# Patient Record
Sex: Female | Born: 2000 | Race: White | Hispanic: No | Marital: Single | Smoking: Never smoker
Health system: Southern US, Community
[De-identification: ages and names within clinical notes are randomized; demographics above are authoritative.]

## PROBLEM LIST (undated history)

## (undated) DIAGNOSIS — K219 Gastro-esophageal reflux disease without esophagitis: Secondary | ICD-10-CM

## (undated) DIAGNOSIS — T7840XA Allergy, unspecified, initial encounter: Secondary | ICD-10-CM

## (undated) DIAGNOSIS — J45909 Unspecified asthma, uncomplicated: Secondary | ICD-10-CM

## (undated) DIAGNOSIS — F419 Anxiety disorder, unspecified: Secondary | ICD-10-CM

## (undated) DIAGNOSIS — F32A Depression, unspecified: Secondary | ICD-10-CM

## (undated) DIAGNOSIS — K5901 Slow transit constipation: Secondary | ICD-10-CM

## (undated) DIAGNOSIS — K3184 Gastroparesis: Secondary | ICD-10-CM

## (undated) HISTORY — DX: Gastroparesis: K31.84

## (undated) HISTORY — PX: WISDOM TOOTH EXTRACTION: SHX21

## (undated) HISTORY — DX: Anxiety disorder, unspecified: F41.9

## (undated) HISTORY — DX: Allergy, unspecified, initial encounter: T78.40XA

## (undated) HISTORY — DX: Gastro-esophageal reflux disease without esophagitis: K21.9

## (undated) HISTORY — PX: NASAL ENDOSCOPY: SHX6577

---

## 2020-09-07 ENCOUNTER — Encounter: Payer: Self-pay | Admitting: Gastroenterology

## 2020-10-24 ENCOUNTER — Ambulatory Visit (INDEPENDENT_AMBULATORY_CARE_PROVIDER_SITE_OTHER): Payer: BC Managed Care – PPO | Admitting: Gastroenterology

## 2020-10-24 ENCOUNTER — Telehealth: Payer: Self-pay | Admitting: Gastroenterology

## 2020-10-24 ENCOUNTER — Encounter: Payer: Self-pay | Admitting: Gastroenterology

## 2020-10-24 VITALS — BP 100/70 | HR 70 | Ht 65.0 in | Wt 198.0 lb

## 2020-10-24 DIAGNOSIS — G8929 Other chronic pain: Secondary | ICD-10-CM

## 2020-10-24 DIAGNOSIS — R1031 Right lower quadrant pain: Secondary | ICD-10-CM | POA: Diagnosis not present

## 2020-10-24 NOTE — Telephone Encounter (Signed)
Left message on machine to call back  

## 2020-10-24 NOTE — Telephone Encounter (Signed)
Reviewed records from CT scan abdomen pelvis with IV and oral contrast on October 09, 2020 in [place].  Ordering physician was her gastroenterologist Dr. Marcha Dutton.  Indication abdominal pain.  Findings "right adnexa 2.0 x 1.6 cm dermoid" the examination was otherwise normal.   Wanda Terry, Wanda Terry goes by the name Wanda Terry.  Can you please call him and let him know that we received the CT report.  He had a relatively small, 2 cm right ovarian dermoid.  This may or may not be causing his lower abdominal discomforts.  I would like to refer him to a gynecologist for their opinion on this.  He also mentioned rather chronic constipation and for that please recommend he start taking Citrucel on a daily basis with a lot of water intake at that time.  Please let him know that if the gynecologist does not feel the dermoid is causing his discomforts then he should contact me again for further testing, treatment options.

## 2020-10-24 NOTE — Progress Notes (Signed)
HPI: This is a very pleasant 20 year old female who identifies as a female, prefers to be called Wanda Terry.  Wanda Terry had issues with his stomach starting when he was 45 or 83.  He was having upper abdominal pains around then.  He had a getting upper endoscopy performed and was told that he had "3 ulcers".  He does recall that he was not H. pylori positive and he was not on NSAIDs around the time.  Unclear why he had these ulcers but symptoms seem to get better until the past few months when he started having a new problem of lower abdominal dull discomfort.  He feels bloated all the time.  He has lost about 10 pounds in the past year or 2.  He has chronic constipation usually moves his bowels about 5 times per week but only with a lot of pushing and straining.  He has had no bleeding.  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  Wanda Terry was never told exactly how large or exactly where the desmoid tumor was.  He has no copy of the report.  He did bring a disc in with him.  I tried to load the disc through the only computer on our clinic that actually has a disc drive anymore.  The computer rejected the disc.  I gave the disc back to him.   Old Data Reviewed: I reviewed a 25 page packet from [place] team.  I really see a variety of medication list problem list, medical history use.  No lab or imaging test results.    Review of systems: Pertinent positive and negative review of systems were noted in the above HPI section. All other review negative.   Past Medical History:  Diagnosis Date  . Gastroparesis     History reviewed. No pertinent surgical history.  Current Outpatient Medications  Medication Sig Dispense Refill  . cetirizine  (ZYRTEC) 10 MG tablet Take 10 mg by mouth daily.    Marland Kitchen EPINEPHrine 0.3 mg/0.3 mL IJ SOAJ injection Inject 0.3 mg into the muscle as needed for anaphylaxis.    . famotidine (PEPCID) 20 MG tablet Take 20 mg by mouth as needed for heartburn or indigestion.    Marland Kitchen lamoTRIgine (LAMICTAL) 150 MG tablet Take 150 mg by mouth daily.    . sertraline (ZOLOFT) 100 MG tablet Take 100 mg by mouth daily.     No current facility-administered medications for this visit.    Allergies as of 10/24/2020 - Review Complete 10/24/2020  Allergen Reaction Noted  . Amoxicillin Anaphylaxis 10/23/2020    History reviewed. No pertinent family history.  Social History   Socioeconomic History  . Marital status: Single    Spouse name: Not on file  . Number of children: Not on file  . Years of education: Not on file  . Highest education level: Not on file  Occupational History  . Not on file  Tobacco Use  . Smoking status: Never Smoker  . Smokeless tobacco: Never Used  Substance and Sexual Activity  . Alcohol use: Not Currently  . Drug use: Never  . Sexual activity: Not on file  Other Topics Concern  . Not on file  Social History Narrative  .  Not on file   Social Determinants of Health   Financial Resource Strain: Not on file  Food Insecurity: Not on file  Transportation Needs: Not on file  Physical Activity: Not on file  Stress: Not on file  Social Connections: Not on file  Intimate Partner Violence: Not on file     Physical Exam: BP 100/70   Pulse 70   Ht 5\' 5"  (1.651 m)   Wt 198 lb (89.8 kg)   BMI 32.95 kg/m  Constitutional: generally well-appearing Psychiatric: alert and oriented x3 Eyes: extraocular movements intact Mouth: oral pharynx moist, no lesions Neck: supple no lymphadenopathy Cardiovascular: heart regular rate and rhythm Lungs: clear to auscultation bilaterally Abdomen: soft, nontender, nondistended, no obvious ascites, no peritoneal signs, normal bowel sounds Extremities:  no lower extremity edema bilaterally Skin: no lesions on visible extremities   Assessment and plan: 20 y.o. 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  We will reach out to the radiologist that read it and see if we get a report sent here.  If there is any questions he understands that he might need repeat imaging.  We may also be able to get his images from his [place] CT scan uploaded into the computer system through the radiologist group here in town so that they can be viewed locally.  After reviewing the CT report we will make a plan and go from there.  I see no reason or purpose for any further testing prior to then.  Please see the "Patient Instructions" section for addition details about the plan.   [place], MD [facility] 10/24/2020, 3:13 PM  Cc: 12/22/2020  Total time on date of encounter was 45  minutes (this included time spent preparing to see the patient reviewing records; obtaining and/or reviewing separately obtained history; performing a medically appropriate exam and/or evaluation; counseling and educating the patient and family if present; ordering medications, tests or procedures if applicable; and documenting clinical information in the health record).

## 2020-10-24 NOTE — Patient Instructions (Signed)
If you are age 20 or younger, your body mass index should be between 19-25. Your Body mass index is 32.95 kg/m. If this is out of the aformentioned range listed, please consider follow up with your Primary Care Provider.   We have requested results of CT scan done in [place].  Once we receive results, we will further discuss plan of care.  Thank you for entrusting me with your care and choosing [facility].  Dr Christella Hartigan

## 2020-10-25 NOTE — Telephone Encounter (Signed)
Left message on machine to call back  

## 2020-10-26 NOTE — Telephone Encounter (Signed)
The pt has been advised and states that he will begin Citrucel as ordered.  He also states that he has GYN in mind that he would like to see at [facility] and will call and make the appt himself.  I offered to have records faxed or make referral for him however, he declined and states he will call back if he needs anything further from our office.

## 2020-10-26 NOTE — Telephone Encounter (Signed)
Left message on machine to call back  

## 2020-10-30 ENCOUNTER — Telehealth: Payer: Self-pay | Admitting: Gastroenterology

## 2020-10-30 NOTE — Telephone Encounter (Signed)
Dr Ardis Hughs the pt wants a note for school excusing him from an upcoming school trip due to abdominal pain.  He is working with his mom to get an appt with GYN.  Please advise

## 2020-10-30 NOTE — Telephone Encounter (Signed)
I'm not comfortable writing that note.

## 2020-10-30 NOTE — Telephone Encounter (Signed)
Patients mother called requesting a letter for school to excuse patient from an upcoming trip

## 2020-10-30 NOTE — Telephone Encounter (Signed)
The pt and her mother have been advised that Dr Ardis Hughs is not comfortable writing a note.  The pt has been advised of the information and verbalized understanding.

## 2020-11-01 ENCOUNTER — Other Ambulatory Visit: Payer: BC Managed Care – PPO

## 2020-11-01 ENCOUNTER — Other Ambulatory Visit: Payer: Self-pay

## 2020-11-01 DIAGNOSIS — Z20822 Contact with and (suspected) exposure to covid-19: Secondary | ICD-10-CM

## 2020-11-03 LAB — SARS-COV-2, NAA 2 DAY TAT

## 2020-11-03 LAB — NOVEL CORONAVIRUS, NAA: SARS-CoV-2, NAA: NOT DETECTED

## 2020-11-10 ENCOUNTER — Encounter: Payer: Self-pay | Admitting: Obstetrics and Gynecology

## 2020-11-10 ENCOUNTER — Ambulatory Visit (INDEPENDENT_AMBULATORY_CARE_PROVIDER_SITE_OTHER): Payer: BC Managed Care – PPO | Admitting: Obstetrics and Gynecology

## 2020-11-10 ENCOUNTER — Other Ambulatory Visit: Payer: Self-pay

## 2020-11-10 VITALS — BP 100/62 | Ht 65.0 in | Wt 194.0 lb

## 2020-11-10 DIAGNOSIS — N83201 Unspecified ovarian cyst, right side: Secondary | ICD-10-CM

## 2020-11-10 DIAGNOSIS — R102 Pelvic and perineal pain: Secondary | ICD-10-CM

## 2020-11-10 NOTE — Progress Notes (Signed)
Patient ID: Wanda Terry, adult   DOB: 2001-07-18, 20 y.o.   MRN: [id number]  Reason for Consult: Pelvic Pain (Pelvic pain - RM 1)   Referred by No ref. provider found  Subjective:     HPI:  Wanda Terry is a 20 y.o. adult. Wanda Terry reports that Wanda Terry has had on and off crampy lower abdominal pain since September. Wanda Terry was seen by a GI doctor. They ordered a CT scan which showed a right dermoid cyst.   Gynecological History LMP: 10/28/2019 Describes periods as monthly.  Last pap smear: Under 21 Last Mammogram: Under 40 Sexually Active: yes, same sex relationship. Identifies as female.   Obstetrical History G0P0  Past Medical History:  Diagnosis Date  . Gastroparesis    Family History  Problem Relation Age of Onset  . Diabetes Maternal Grandfather   . Heart disease Maternal Grandfather    History reviewed. No pertinent surgical history.  Short Social History:  Social History   Tobacco Use  . Smoking status: Never Smoker  . Smokeless tobacco: Never Used  Substance Use Topics  . Alcohol use: Not Currently    Allergies  Allergen Reactions  . Amoxicillin Anaphylaxis  . Other Anaphylaxis    Peanuts and tree nuts    Current Outpatient Medications  Medication Sig Dispense Refill  . cetirizine (ZYRTEC) 10 MG tablet Take 10 mg by mouth daily.    Marland Kitchen EPINEPHrine 0.3 mg/0.3 mL IJ SOAJ injection Inject 0.3 mg into the muscle as needed for anaphylaxis.    Marland Kitchen lamoTRIgine (LAMICTAL) 150 MG tablet Take 150 mg by mouth 2 (two) times daily.    . sertraline (ZOLOFT) 100 MG tablet Take 100 mg by mouth daily.    . famotidine (PEPCID) 20 MG tablet Take 20 mg by mouth as needed for heartburn or indigestion. (Patient not taking: Reported on 11/10/2020)     No current facility-administered medications for this visit.    Review of Systems  Constitutional: Negative for chills, fatigue, fever and unexpected weight change.  HENT: Negative for trouble swallowing.  Eyes: Negative for  loss of vision.  Respiratory: Negative for cough, shortness of breath and wheezing.  Cardiovascular: Negative for chest pain, leg swelling, palpitations and syncope.  GI: Negative for abdominal pain, blood in stool, diarrhea, nausea and vomiting.  GU: Negative for difficulty urinating, dysuria, frequency and hematuria.  Musculoskeletal: Negative for back pain, leg pain and joint pain.  Skin: Negative for rash.  Neurological: Negative for dizziness, headaches, light-headedness, numbness and seizures.  Psychiatric: Negative for behavioral problem, confusion, depressed mood and sleep disturbance.        Objective:  Objective   Vitals:   11/10/20 1604  BP: 100/62  Weight: 194 lb (88 kg)  Height: 5\' 5"  (1.651 m)   Body mass index is 32.28 kg/m.  Physical Exam Constitutional:      Appearance: Normal appearance.  HENT:     Head: Normocephalic.     Nose: Nose normal.     Mouth/Throat:     Mouth: Mucous membranes are dry.  Eyes:     Extraocular Movements: Extraocular movements intact.     Pupils: Pupils are equal, round, and reactive to light.  Cardiovascular:     Rate and Rhythm: Normal rate and regular rhythm.  Genitourinary:    Comments: Vulva normal. Did not tolerate pelvic exam.  Neurological:     Mental Status: He is alert.     Assessment/Plan:     20 yo with right ovarian dermoid  Dermoid seen on CT. Will follow up for GYN [place].  Discussed management options of observation versus surgical removal. Patient would like to proceed with surgical removal. Will schedule for robotic ovarian cystectomy. Patient understands that ovarian cystectomy may not resolve complaints of lower abdominal pain.   More than 20 minutes were spent face to face with the patient in the room, reviewing the medical record, labs and images, and coordinating care for the patient. The plan of management was discussed in detail and counseling was provided.      Adrian Prows MD [facility],  [facility] 11/10/2020 4:22 PM

## 2020-11-10 NOTE — Patient Instructions (Addendum)
Diagnostic Laparoscopy Diagnostic laparoscopy is a procedure to diagnose problems in the abdomen. It might be done for a variety of reasons, such as to look for scar tissue, a reason for abdominal pain, an abdominal mass or tumor, or fluid in the abdomen (ascites). This procedure may also be done to remove a tissue sample from the liver to look at under a microscope (biopsy). During the procedure, a thin, flexible tube that has a light and a camera on the end (laparoscope) is inserted through a small incision in the abdomen. The image from the camera is shown on a monitor to help the surgeon see inside the body. Tell a health care provider about:  Any allergies you have.  All medicines you are taking, including vitamins, herbs, eye drops, creams, and over-the-counter medicines.  Any problems you or family members have had with anesthetic medicines.  Any blood disorders you have.  Any surgeries you have had.  Any medical conditions you have.  Whether you are pregnant or may be pregnant. What are the risks? Generally, this is a safe procedure. However, problems may occur, including:  Infection.  Bleeding.  Allergic reactions to medicines or dyes.  Damage to abdominal structures or organs, such as the intestines, liver, stomach, or spleen. What happens before the procedure? Staying hydrated Follow instructions from your health care provider about hydration, which may include:  Up to 2 hours before the procedure - you may continue to drink clear liquids, such as water, clear fruit juice, black coffee, and plain tea.   Eating and drinking restrictions Follow instructions from your health care provider about eating and drinking, which may include:  8 hours before the procedure - stop eating heavy meals or foods, such as meat, fried foods, or fatty foods.  6 hours before the procedure - stop eating light meals or foods, such as toast or cereal.  6 hours before the procedure - stop  drinking milk or drinks that contain milk.  2 hours before the procedure - stop drinking clear liquids. Medicines Ask your health care provider about:  Changing or stopping your regular medicines. This is especially important if you are taking diabetes medicines or blood thinners.  Taking medicines such as aspirin and ibuprofen. These medicines can thin your blood. Do not take these medicines unless your health care provider tells you to take them.  Taking over-the-counter medicines, vitamins, herbs, and supplements. General instructions  Ask your health care provider: ? How your surgery site will be marked. ? What steps will be taken to help prevent infection. These steps may include:  Removing hair at the surgery site.  Washing skin with a germ-killing soap.  Taking antibiotic medicine.  Plan to have a responsible adult take you home from the hospital or clinic.  Plan to have a responsible adult care for you for the time you are told after you leave the hospital or clinic. This is important. What happens during the procedure?  An IV will be inserted into one of your veins.  You will be given one or more of the following: ? A medicine to help you relax (sedative). ? A medicine to numb the area (local anesthetic). ? A medicine to make you fall asleep (general anesthetic).  A breathing tube will be placed down your throat to help you breathe during the procedure.  Your abdomen will be filled with an air-like gas so that your abdomen expands. This will give the surgeon more room to operate and will make  your organs easier to see.  Many small incisions will be made in your abdomen.  A laparoscope and other surgical instruments will be inserted into your abdomen through these incisions.  A biopsy may be done. This will depend on the reason why you are having this procedure.  The laparoscope and other instruments will be removed from your abdomen.  The air-like gas will be  released from your abdomen.  Your incisions will be closed with stitches (sutures), skin glue, or surgical tapes and covered with a bandage (dressing).  Your breathing tube will be removed. The procedure may vary among health care providers and hospitals.   What happens after the procedure?  Your blood pressure, heart rate, breathing rate, and blood oxygen level will be monitored until you leave the hospital or clinic.  If you were given a sedative during the procedure, it can affect you for several hours. Do not drive or operate machinery until your health care provider says that it is safe.  It is up to you to get the results of your procedure. Ask your health care provider, or the department that is doing the procedure, when your results will be ready. Summary  Diagnostic laparoscopy is a procedure to diagnose problems in the abdomen using a thin, flexible tube that has a light and a camera on the end (laparoscope).  Follow instructions from your health care provider about how to prepare for the procedure.  Plan to have a responsible adult care for you for the time you are told after you leave the hospital or clinic. This is important. This information is not intended to replace advice given to you by your health care provider. Make sure you discuss any questions you have with your health care provider. Document Revised: 06/02/2020 Document Reviewed: 06/02/2020 Elsevier Patient Education  2021 [place]. Ovarian Cyst  An ovarian cyst is a fluid-filled sac that forms on an ovary. The ovaries are small organs that produce eggs in women. Various types of cysts can form on the ovaries. Some may cause symptoms and require treatment. Most ovarian cysts go away on their own, are not cancerous (are benign), and do not cause problems. What are the causes? Ovarian cysts may be caused by:  Ovarian hyperstimulation syndrome. This is a condition that can develop from taking fertility  medicines. It causes multiple large ovarian cysts to form.  Polycystic ovarian syndrome (PCOS). This is a common hormonal disorder that can cause ovarian cysts to form, and can cause problems with your period or fertility.  The normal menstrual cycle. What increases the risk? The following factors may make you more likely to develop this condition:  Being overweight or obese.  Taking fertility medicines.  Taking certain forms of hormonal birth control.  Smoking. What are the signs or symptoms? Many ovarian cysts do not cause symptoms. If symptoms are present, they may include:  Pelvic pain or pressure.  Pain in the lower abdomen.  Pain during sex.  Abdominal swelling.  Abnormal menstrual periods.  Increasing pain with menstrual periods. How is this diagnosed? These cysts are commonly found during a routine pelvic exam. You may have tests to find out more about the cyst, such as:  Ultrasound.  CT scan.  MRI.  Blood tests. How is this treated? Many ovarian cysts go away on their own without treatment. Your health care provider may want to check your cyst regularly for 2-3 months to see if it changes. If you are in menopause, it is  especially important to have your cyst monitored closely because menopausal women have a higher rate of ovarian cancer. When treatment is needed, it may include:  Medicines to help relieve pain.  A procedure to drain the cyst (aspiration).  Surgery to remove the whole cyst (cystectomy).  Hormone treatment or birth control pills. These methods are sometimes used to help keep cysts from coming back.  Surgery to remove the ovary (oophorectomy). Follow these instructions at home:  Take over-the-counter and prescription medicines only as told by your health care provider.  Ask your health care provider if any medicine prescribed to you requires you to avoid driving or using machinery.  Get regular pelvic exams and Pap tests as often as  told by your health care provider.  Return to your normal activities as told by your health care provider. Ask your health care provider what activities are safe for you.  Do not use any products that contain nicotine or tobacco, such as cigarettes, e-cigarettes, and chewing tobacco. If you need help quitting, ask your health care provider.  Keep all follow-up visits. This is important. Contact a health care provider if:  Your periods are late, irregular, painful, or they stop.  You have pelvic pain that does not go away.  You have pressure on your bladder or trouble emptying your bladder completely.  You have any of the following: ? A feeling of fullness. ? You are gaining weight or losing weight without changing your exercise and eating habits. ? Pain, swelling, or bloating in the abdomen. ? Loss of appetite. ? Pain and pressure in your back and pelvis.  You think you may be pregnant. Get help right away if:  You have abdominal or pelvic pain that is severe or gets worse.  You cannot eat or drink without vomiting.  You suddenly develop a fever or chills.  Your menstrual period is much heavier than usual. Summary  An ovarian cyst is a fluid-filled sac that forms on an ovary.  Some ovarian cysts may cause symptoms and require treatment.  These cysts are commonly found during a routine pelvic exam.  Many ovarian cysts go away on their own without treatment. This information is not intended to replace advice given to you by your health care provider. Make sure you discuss any questions you have with your health care provider. Document Revised: 03/16/2020 Document Reviewed: 03/16/2020 Elsevier Patient Education  2021 [employer].

## 2020-11-15 ENCOUNTER — Telehealth: Payer: Self-pay

## 2020-11-15 NOTE — Telephone Encounter (Signed)
Pt would like a call back from nurse. Pt is asking if the [place] is needed in order for her to have her laparoscopic surgery or if she can still have surgery without the [place]. Please advise. Thanks TNP

## 2020-11-17 ENCOUNTER — Other Ambulatory Visit: Payer: Self-pay | Admitting: Obstetrics and Gynecology

## 2020-11-17 DIAGNOSIS — N83201 Unspecified ovarian cyst, right side: Secondary | ICD-10-CM

## 2020-11-17 NOTE — Telephone Encounter (Signed)
Patient returned call to schedule Robotic ovarian cystectomy w [facility]  DOS 2/10  H&P 2/4 @  10:30  Covid testing 2/8 @ 8-10:30, [facility] [employer], drive up and wear mask. Advised pt to quarantine until DOS.  Pre-admit phone call appointment to be requested - date and time will be included on H&P paper work. Also all appointments will be updated on pt MyChart. Explained that this appointment has a call window. Based on the time scheduled will indicate if the call will be received within a 4 hour window before 1:00 or after.  Advised that pt may also receive calls from the hospital pharmacy and pre-service center.  Confirmed pt has BCBS as Chartered certified accountant. No secondary insurance.  Pt called on 1/26 asking if TVUS was necessary or if they could have TAUS. I spoke to [facility] and she said abdominal u/s is fine.  Pt also needs a letter for school stating that they are scheduled for surgery and the recovery expected.

## 2020-11-17 NOTE — Telephone Encounter (Signed)
Left a message for the patient to return the call.  

## 2020-11-17 NOTE — Telephone Encounter (Signed)
-----   Message from Homero Fellers, MD sent at 11/10/2020  4:23 PM EST ----- Surgery Booking Request Patient Full Name:  Wanda Terry  MRN: [id number]  DOB: 2001-10-03  Surgeon: Homero Fellers, MD  Requested Surgery Date and Time: February 2022 Primary Diagnosis AND Code: right ovarian cyst Secondary Diagnosis and Code:  Surgical Procedure: Robotic Ovarian Cystectomy RNFA Requested?: No L&D Notification: No Admission Status: same day surgery Length of Surgery: 100 min Special Case Needs: No H&P: Yes Phone Interview???:  No Interpreter: No Medical Clearance:  No Special Scheduling Instructions: No Any known health/anesthesia issues, diabetes, sleep apnea, latex allergy, defibrillator/pacemaker?: No Acuity: P2   (P1 highest, P2 delay may cause harm, P3 low, elective gyn, P4 lowest)

## 2020-11-20 NOTE — Telephone Encounter (Signed)
Orders placed.

## 2020-11-21 ENCOUNTER — Telehealth: Payer: Self-pay | Admitting: Gastroenterology

## 2020-11-21 NOTE — Telephone Encounter (Signed)
Wanda Terry with [name] called to advise that hey received a fax from [place] and they are not sure that it was mean to be for them or a different department. That  Department is Physical Therapy and it is a student run clinic so they are usually in class. Plls leave a message in case nobody answers the phone.

## 2020-11-21 NOTE — Telephone Encounter (Signed)
I see no notations about anything being faxed from our office.  Called to let Cami at [facility] aware that we have not faxed anything from this office. Had to leave a message and asked her to call back with any further questions.

## 2020-11-23 ENCOUNTER — Encounter: Payer: BC Managed Care – PPO | Admitting: Certified Nurse Midwife

## 2020-11-24 ENCOUNTER — Encounter: Payer: Self-pay | Admitting: Obstetrics and Gynecology

## 2020-11-24 ENCOUNTER — Ambulatory Visit (INDEPENDENT_AMBULATORY_CARE_PROVIDER_SITE_OTHER): Payer: BC Managed Care – PPO | Admitting: Obstetrics and Gynecology

## 2020-11-24 ENCOUNTER — Other Ambulatory Visit: Payer: Self-pay | Admitting: Obstetrics and Gynecology

## 2020-11-24 ENCOUNTER — Ambulatory Visit (INDEPENDENT_AMBULATORY_CARE_PROVIDER_SITE_OTHER): Payer: BC Managed Care – PPO

## 2020-11-24 ENCOUNTER — Other Ambulatory Visit: Payer: Self-pay

## 2020-11-24 VITALS — BP 138/76 | Ht 65.0 in | Wt 197.8 lb

## 2020-11-24 DIAGNOSIS — N83201 Unspecified ovarian cyst, right side: Secondary | ICD-10-CM

## 2020-11-24 DIAGNOSIS — R102 Pelvic and perineal pain: Secondary | ICD-10-CM | POA: Diagnosis not present

## 2020-11-24 NOTE — Progress Notes (Signed)
US results

## 2020-11-24 NOTE — Patient Instructions (Signed)
Diagnostic Laparoscopy, Care After The following information offers guidance on how to care for yourself after your procedure. Your health care provider may also give you more specific instructions. If you have problems or questions, contact your health care provider. What can I expect after the procedure? After the procedure, it is common to have:  Mild discomfort in the abdomen.  Sore throat. Women who have laparoscopy with a pelvic examination may have mild cramping and fluid coming from the vagina for a few days after the procedure. Follow these instructions at home: Medicines  Take over-the-counter and prescription medicines only as told by your health care provider.  If you were prescribed an antibiotic medicine, take it as told by your health care provider. Do not stop taking the antibiotic even if you start to feel better.  Ask your health care provider if the medicine prescribed to you: ? Requires you to avoid driving or using machinery. ? Can cause constipation. You may need to take these actions to prevent or treat constipation:  Drink enough fluid to keep your urine pale yellow.  Take over-the-counter or prescription medicines.  Eat foods that are high in fiber, such as beans, whole grains, and fresh fruits and vegetables.  Limit foods that are high in fat and processed sugars, such as fried or sweet foods. Incision care  Follow instructions from your health care provider about how to take care of your incisions. Make sure you: ? Wash your hands with soap and water for at least 20 seconds before and after you change your bandage (dressing). If soap and water are not available, use hand sanitizer. ? Change your dressing as told by your health care provider. ? Leave stitches (sutures), skin glue, or surgical tape in place. These skin closures may need to stay in place for 2 weeks or longer. If surgical tape edges start to loosen and curl up, you may trim the loose edges. Do  not remove the surgical tape completely unless your health care provider tells you to do that.  Check your incision areas every day for signs of infection. Check for: ? Redness, swelling, or pain. ? Fluid or blood. ? Warmth. ? Pus or a bad smell.   Activity  Return to your normal activities as told by your health care provider. Ask your health care provider what activities are safe for you.  Do not lift anything that is heavier than 10 lb (4.5 kg), or the limit that you are told, until your health care provider says that it is safe.  Avoid sitting for a long time without moving. Get up to take short walks every 1-2 hours. This is important to improve blood flow and breathing. Ask for help if you feel weak or unsteady. General instructions  Do not use any products that contain nicotine or tobacco. These products include cigarettes, chewing tobacco, and vaping devices, such as e-cigarettes. If you need help quitting, ask your health care provider.  If you were given a sedative during the procedure, it can affect you for several hours. Do not drive or operate machinery until your health care provider says that it is safe.  Do not take baths, swim, or use a hot tub until your health care provider approves. Ask your health care provider if you may take showers. You may only be allowed to take sponge baths.  Keep all follow-up visits. This is important. Contact a health care provider if:  You develop shoulder pain.  You feel light-headed or  faint.  You are unable to pass gas or have a bowel movement.  You feel nauseous or you vomit.  You develop a rash.  You have any of these signs of infection: ? Redness, swelling, or pain around an incision. ? Fluid or blood coming from an incision. ? Warmth coming from an incision. ? Pus or a bad smell coming from an incision. ? A fever or chills. Get help right away if:  You have severe pain.  You have vomiting that does not go away.  You  have heavy bleeding from the vagina.  Any incision opens up.  You have trouble breathing.  You have chest pain. These symptoms may represent a serious problem that is an emergency. Do not wait to see if the symptoms will go away. Get medical help right away. Call your local emergency services (911 in the [place] Do not drive yourself to the hospital. Summary  After the procedure, it is common to have mild discomfort in the abdomen and a sore throat.  Check your incision areas every day for signs of infection.  Return to your normal activities as told by your health care provider. Ask your health care provider what activities are safe for you. This information is not intended to replace advice given to you by your health care provider. Make sure you discuss any questions you have with your health care provider. Document Revised: 06/02/2020 Document Reviewed: 06/02/2020 Elsevier Patient Education  2021 [employer].

## 2020-11-24 NOTE — Progress Notes (Signed)
Patient ID: Wanda Terry, adult   DOB: [birth year], 20 y.o.   MRN: [id number]  Reason for Consult: Gynecologic Exam ([place] Results)   Referred by No ref. provider found  Subjective:     HPI:  Wanda Terry is a 20 y.o. adult. She is here for a preoperative visit for a right ovarian cystectomy for a dermoid cyst.   Past Medical History:  Diagnosis Date  . Gastroparesis    Family History  Problem Relation Age of Onset  . Diabetes Maternal Grandfather   . Heart disease Maternal Grandfather    History reviewed. No pertinent surgical history.  Short Social History:  Social History   Tobacco Use  . Smoking status: Never Smoker  . Smokeless tobacco: Never Used  Substance Use Topics  . Alcohol use: Not Currently    Allergies  Allergen Reactions  . Amoxicillin Anaphylaxis  . Other Anaphylaxis    Peanuts and tree nuts    Current Outpatient Medications  Medication Sig Dispense Refill  . albuterol (VENTOLIN HFA) 108 (90 Base) MCG/ACT inhaler Inhale 2 puffs into the lungs every 6 (six) hours as needed for wheezing or shortness of breath.    . cetirizine (ZYRTEC) 10 MG tablet Take 10 mg by mouth daily.    Marland Kitchen EPINEPHrine 0.3 mg/0.3 mL IJ SOAJ injection Inject 0.3 mg into the muscle as needed for anaphylaxis.    Marland Kitchen ibuprofen (ADVIL) 200 MG tablet Take 200 mg by mouth every 6 (six) hours as needed for headache or mild pain.    Marland Kitchen lamoTRIgine (LAMICTAL) 150 MG tablet Take 300 mg by mouth daily.    . Melatonin 5 MG CAPS Take 5 mg by mouth at bedtime as needed (sleep).    . Peppermint Oil (IBGARD PO) Take 2-3 tablets by mouth daily as needed (stomach pain).    Marland Kitchen sertraline (ZOLOFT) 100 MG tablet Take 150 mg by mouth daily.     No current facility-administered medications for this visit.    Review of Systems  Constitutional: Negative for chills, fatigue, fever and unexpected weight change.  HENT: Negative for trouble swallowing.  Eyes: Negative for loss of vision.   Respiratory: Negative for cough, shortness of breath and wheezing.  Cardiovascular: Negative for chest pain, leg swelling, palpitations and syncope.  GI: Negative for abdominal pain, blood in stool, diarrhea, nausea and vomiting.  GU: Negative for difficulty urinating, dysuria, frequency and hematuria.  Musculoskeletal: Negative for back pain, leg pain and joint pain.  Skin: Negative for rash.  Neurological: Negative for dizziness, headaches, light-headedness, numbness and seizures.  Psychiatric: Negative for behavioral problem, confusion, depressed mood and sleep disturbance.        Objective:  Objective   Vitals:   11/24/20 1032  BP: 138/76  Weight: 197 lb 12.8 oz (89.7 kg)  Height: 5\' 5"  (1.651 m)   Body mass index is 32.92 kg/m.  Physical Exam Constitutional:      Appearance: Normal appearance.  HENT:     Head: Normocephalic.  Eyes:     Extraocular Movements: Extraocular movements intact.     Pupils: Pupils are equal, round, and reactive to light.  Cardiovascular:     Rate and Rhythm: Normal rate and regular rhythm.  Pulmonary:     Effort: Pulmonary effort is normal.     Breath sounds: Normal breath sounds.  Musculoskeletal:     Cervical back: Normal range of motion.  Skin:    General: Skin is warm and dry.  Neurological:  General: No focal deficit present.     Mental Status: He is alert and oriented to person, place, and time.  Psychiatric:        Mood and Affect: Mood normal.        Behavior: Behavior normal.     Assessment/Plan:     20 yo with right ovarian dermoid cyst.  We discussed anticipated surgical recovery and risks associated to surgery which include but are not limited to infection, risk of damage to surrounding abdominal and pelvic organs, and bleeding which could require transfusion. After this discussion he may want to change surgery date to a time which would be more convenient for classes. Note sent to surgical scheduler. Provided him  with a note so to help accommodate for remote class work.   More than 20 minutes were spent face to face with the patient in the room, reviewing the medical record, labs and images, and coordinating care for the patient. The plan of management was discussed in detail and counseling was provided.     Adrian Prows MD [facility], [facility] 11/24/2020 11:29 AM

## 2020-11-27 ENCOUNTER — Inpatient Hospital Stay: Admission: RE | Admit: 2020-11-27 | Payer: BC Managed Care – PPO | Source: Ambulatory Visit

## 2020-11-27 ENCOUNTER — Telehealth: Payer: Self-pay

## 2020-11-27 NOTE — Telephone Encounter (Signed)
pts mother calling about her daughters surgery. She would like to speak with Dr Gilman Schmidt, will her daughter be out school for 2 weeks, shes coming from [place] needs to know why the plan changed. Please call  [phone]  Pt's mom Anderson Malta,

## 2020-11-27 NOTE — Telephone Encounter (Signed)
Called patient's mom and answered questions- please touch base at end of the day with Wanda Terry.

## 2020-11-27 NOTE — Telephone Encounter (Signed)
Left a message for the patient to return the call.  

## 2020-11-28 ENCOUNTER — Other Ambulatory Visit: Payer: BC Managed Care – PPO

## 2020-11-30 ENCOUNTER — Telehealth: Payer: Self-pay

## 2020-11-30 ENCOUNTER — Ambulatory Visit
Admission: RE | Admit: 2020-11-30 | Payer: BC Managed Care – PPO | Source: Home / Self Care | Admitting: Obstetrics and Gynecology

## 2020-11-30 ENCOUNTER — Encounter: Admission: RE | Payer: Self-pay | Source: Home / Self Care

## 2020-11-30 SURGERY — EXCISION, CYST, OVARY, ROBOT-ASSISTED, LAPAROSCOPIC
Anesthesia: Choice | Laterality: Right

## 2020-11-30 NOTE — Telephone Encounter (Signed)
-----   Message from Homero Fellers, MD sent at 11/24/2020 11:27 AM EST ----- Patient may need to delay surgery- please check with her next week.  Thank you,  Dr. Gilman Schmidt

## 2020-11-30 NOTE — Telephone Encounter (Signed)
Patient called back to re-schedule robotic assisted laparoscopic right cystectomy w [facility].  DOS 03/15/21 - waiting until the end of the semester  H&P 5/20 @ 1:50 - orig H&P 2/4   Will hold on Covid scheduling until closer to [place] phone call appointment to be requested - date and time will be included on H&P paper work. Also all appointments will be updated on pt MyChart. Explained that this appointment has a call window. Based on the time scheduled will indicate if the call will be received within a 4 hour window before 1:00 or after.  Advised that pt may also receive calls from the hospital pharmacy and pre-service center.  Confirmed pt has BCBS as Chartered certified accountant. No secondary insurance.  ----------------  **Patient asked if they would be ok to travel for one hour distance by car after surgery.  Also, will post-op appt be one or two weeks after surgery?  CRS plz advise

## 2020-11-30 NOTE — Telephone Encounter (Signed)
She might not feel up for a one hour car ride the day of the surgery, but she could if she had to.  Post op appt is generally 1 week after surgery.

## 2021-02-27 ENCOUNTER — Other Ambulatory Visit: Payer: Self-pay | Admitting: Obstetrics and Gynecology

## 2021-02-27 ENCOUNTER — Telehealth: Payer: Self-pay

## 2021-02-27 DIAGNOSIS — D369 Benign neoplasm, unspecified site: Secondary | ICD-10-CM

## 2021-02-27 DIAGNOSIS — N83201 Unspecified ovarian cyst, right side: Secondary | ICD-10-CM

## 2021-02-27 NOTE — Telephone Encounter (Signed)
Patient returned my call to confirm robotic assisted laparoscopic cystectomy - right  w Hebert Soho 5/26  H&P 5/20 @ 1:50   Pre-admit phone call appointment to be requested - date and time will be included on H&P paper work. Also all appointments will be updated on pt MyChart. Explained that this appointment has a call window. Based on the time scheduled will indicate if the call will be received within a 4 hour window before 1:00 or after.  Advised that pt may also receive calls from the hospital pharmacy and pre-service center.  Confirmed pt has BCBS as Chartered certified accountant. No secondary insurance.

## 2021-02-27 NOTE — Telephone Encounter (Signed)
Orders placed.

## 2021-02-27 NOTE — Telephone Encounter (Signed)
Left a message for the patient to return the call.  

## 2021-02-28 ENCOUNTER — Other Ambulatory Visit: Payer: Self-pay | Admitting: Obstetrics and Gynecology

## 2021-02-28 ENCOUNTER — Other Ambulatory Visit: Payer: Self-pay

## 2021-02-28 ENCOUNTER — Ambulatory Visit
Admission: RE | Admit: 2021-02-28 | Discharge: 2021-02-28 | Disposition: A | Discharge status: Home / Self Care | Payer: BC Managed Care – PPO | Source: Ambulatory Visit | Attending: Obstetrics and Gynecology | Admitting: Obstetrics and Gynecology

## 2021-02-28 DIAGNOSIS — D369 Benign neoplasm, unspecified site: Secondary | ICD-10-CM

## 2021-02-28 DIAGNOSIS — N83201 Unspecified ovarian cyst, right side: Secondary | ICD-10-CM | POA: Insufficient documentation

## 2021-03-09 ENCOUNTER — Encounter: Payer: Self-pay | Admitting: Obstetrics and Gynecology

## 2021-03-09 ENCOUNTER — Other Ambulatory Visit: Payer: Self-pay

## 2021-03-09 ENCOUNTER — Ambulatory Visit (INDEPENDENT_AMBULATORY_CARE_PROVIDER_SITE_OTHER): Payer: BC Managed Care – PPO | Admitting: Obstetrics and Gynecology

## 2021-03-09 VITALS — BP 114/70 | Ht 65.0 in | Wt 202.4 lb

## 2021-03-09 DIAGNOSIS — R102 Pelvic and perineal pain: Secondary | ICD-10-CM | POA: Diagnosis not present

## 2021-03-09 DIAGNOSIS — N83201 Unspecified ovarian cyst, right side: Secondary | ICD-10-CM

## 2021-03-09 NOTE — Progress Notes (Signed)
Patient ID: Wanda Terry, adult      DOB: 2001/02/12, 20 y.o.   MRN: [id number]  Reason for Consult: No chief complaint on file.   Referred by No ref. provider found  Subjective:     HPI:  Wanda Terry is a 20 y.o. adult. They are following up today regarding a right ovarian dermoid. This was first seen in the fall of September 2021 when they were evaluated for pelvic pain. Recent pelvic ultrasound showed the continued presence of the dermoid cyst and surgery for removal is planned for next week.   Gynecological History  Patient's last menstrual period was 02/02/2021.   Past Medical History:  Diagnosis Date  . Gastroparesis    Family History  Problem Relation Age of Onset  . Diabetes Maternal Grandfather   . Heart disease Maternal Grandfather    History reviewed. No pertinent surgical history.  Short Social History:  Social History   Tobacco Use  . Smoking status: Never Smoker  . Smokeless tobacco: Never Used  Substance Use Topics  . Alcohol use: Not Currently    Allergies  Allergen Reactions  . Amoxicillin Anaphylaxis  . Other Anaphylaxis    Peanuts and tree nuts    Current Outpatient Medications  Medication Sig Dispense Refill  . albuterol (VENTOLIN HFA) 108 (90 Base) MCG/ACT inhaler Inhale 2 puffs into the lungs every 6 (six) hours as needed for wheezing or shortness of breath.    . EPINEPHrine 0.3 mg/0.3 mL IJ SOAJ injection Inject 0.3 mg into the muscle as needed for anaphylaxis.    Marland Kitchen ibuprofen (ADVIL) 200 MG tablet Take 200 mg by mouth every 6 (six) hours as needed for headache or mild pain.    Marland Kitchen lamoTRIgine (LAMICTAL) 150 MG tablet Take 300 mg by mouth daily.    Marland Kitchen loratadine (CLARITIN) 10 MG tablet Take 10 mg by mouth daily.    . Melatonin 5 MG CAPS Take 5 mg by mouth at bedtime as needed (sleep).    . Peppermint Oil (IBGARD PO) Take 2-3 tablets by mouth daily as needed (stomach pain).    Marland Kitchen sertraline (ZOLOFT) 100 MG tablet Take 200 mg by mouth  daily.     No current facility-administered medications for this visit.    Review of Systems  Constitutional: Negative for chills, fatigue, fever and unexpected weight change.  HENT: Negative for trouble swallowing.  Eyes: Negative for loss of vision.  Respiratory: Negative for cough, shortness of breath and wheezing.  Cardiovascular: Negative for chest pain, leg swelling, palpitations and syncope.  GI: Negative for abdominal pain, blood in stool, diarrhea, nausea and vomiting.  GU: Negative for difficulty urinating, dysuria, frequency and hematuria.  Musculoskeletal: Negative for back pain, leg pain and joint pain.  Skin: Negative for rash.  Neurological: Negative for dizziness, headaches, light-headedness, numbness and seizures.  Psychiatric: Negative for behavioral problem, confusion, depressed mood and sleep disturbance.        Objective:  Objective   Vitals:   03/09/21 1349  BP: 114/70  Weight: 202 lb 6.4 oz (91.8 kg)  Height: 5\' 5"  (1.651 m)   Body mass index is 33.68 kg/m.  Physical Exam Vitals and nursing note reviewed. Exam conducted with a chaperone present.  Constitutional:      Appearance: Normal appearance.  HENT:     Head: Normocephalic and atraumatic.  Eyes:     Extraocular Movements: Extraocular movements intact.     Pupils: Pupils are equal, round, and reactive to light.  Cardiovascular:  Rate and Rhythm: Normal rate and regular rhythm.  Pulmonary:     Effort: Pulmonary effort is normal.     Breath sounds: Normal breath sounds.  Abdominal:     General: Abdomen is flat.     Palpations: Abdomen is soft.  Musculoskeletal:     Cervical back: Normal range of motion.  Skin:    General: Skin is warm and dry.  Neurological:     General: No focal deficit present.     Mental Status: He is alert and oriented to person, place, and time.  Psychiatric:        Behavior: Behavior normal.        Thought Content: Thought content normal.        Judgment:  Judgment normal.     Assessment/Plan:     20 yo with a right ovarian dermoid Reviewed the plan for Xi- robot assisted laparoscopic right ovarian cystectomy.  We discussed anticipated surgical recovery and risks associated to surgery which include but are not limited to infection, risk of damage to surrounding abdominal and pelvic organs, and bleeding which could require transfusion. We discussed anticipated recovery at home and postoperative care. All questions were answered. Surgical consents were signed.   More than 20 minutes were spent face to face with the patient in the room, reviewing the medical record, labs and images, and coordinating care for the patient. The plan of management was discussed in detail and counseling was provided.   Adrian Prows MD [facility], [facility] 03/09/2021 2:40 PM

## 2021-03-09 NOTE — Patient Instructions (Signed)
[email or web address].org/[email or web address]-[email or web address]/[email or web address]-[email or web address]  Ovarian Cystectomy Ovarian cystectomy is a procedure that is done to remove a fluid-filled sac (cyst) on an ovary. The ovaries are small organs that produce eggs in women. Various types of cysts can form on the ovaries. Most cysts are not cancerous. This procedure may be done for cysts that are large, cause symptoms, or do not go away on their own. It may also be done for a cyst that is cancerous or might be cancerous. This surgery can be done using a laparoscopic technique or an open abdominal technique. The laparoscopic technique is minimally invasive and results in smaller incisions and a faster recovery. The technique used will depend on your age, the type of cyst that you have, and whether the cyst is cancerous. The laparoscopic technique is not used for a cancerous cyst. Tell a health care provider about:  Any allergies you have.  All medicines you are taking, including vitamins, herbs, eye drops, creams, and over-the-counter medicines.  Any problems you or family members have had with anesthetic medicines.  Any blood disorders you have.  Any surgeries you have had.  Any medical conditions you have.  Whether you are pregnant or may be pregnant. What are the risks? Generally, this is a safe procedure. However, problems may occur, including:  Excessive bleeding.  Infection.  Damage to nearby structures or organs.  Allergic reactions to medicines.  Blood clots.  Inability to get pregnant (infertility). What happens before the procedure? Staying hydrated Follow instructions from your health care provider about hydration, which may include:  Up to 2 hours before the procedure - you may continue to drink clear liquids, such as water, clear fruit juice, black coffee, and plain tea.   Eating and drinking restrictions Follow instructions from your health care provider about eating and drinking, which may include:  8 hours  before the procedure - stop eating heavy meals or foods, such as meat, fried foods, or fatty foods.  6 hours before the procedure - stop eating light meals or foods, such as toast or cereal.  6 hours before the procedure - stop drinking milk or drinks that contain milk.  2 hours before the procedure - stop drinking clear liquids. Medicines Ask your health care provider about:  Changing or stopping your regular medicines. This is especially important if you are taking diabetes medicines or blood thinners.  Taking medicines such as aspirin and ibuprofen. These medicines can thin your blood. Do not take these medicines unless your health care provider tells you to take them.  Taking over-the-counter medicines, vitamins, herbs, and supplements. General instructions  Do not use any products that contain nicotine or tobacco for at least 4 weeks before the procedure. These products include cigarettes, e-cigarettes, and chewing tobacco. If you need help quitting, ask your health care provider.  Ask your health care provider: ? How your surgery site will be marked. ? What steps will be taken to help prevent infection. These may include:  Removing hair at the surgery site.  Washing skin with a germ-killing soap.  Taking antibiotic medicine.  You may be asked to shower with a germ-killing soap.  Plan to have someone take you home from the hospital or clinic.  Plan to have someone help with household activities for 1-2 weeks after the procedure.  Let your health care provider know if you develop a cold or any infection before your surgery. What happens during the procedure?  An IV will be inserted into one of your veins.  You will be given one or more of the following: ? A medicine to help you relax (sedative). ? A medicine to make you fall asleep (general anesthetic).  Small monitors will be attached to your body. They will be used to check your heart, blood pressure, and oxygen  level.  A breathing tube will be placed to help you breathe during the procedure.  Your surgeon will do the surgery using either the laparoscopic technique or the open abdominal technique. Laparoscopic technique  Several small incisions will be made in your abdomen.  Your abdomen will be filled with carbon dioxide gas to make it expand. This will give the surgeon more room to operate. It will also make your organs easier to see.  A thin scope with a camera (laparoscope) will be put through one of the small incisions. The laparoscope will send a picture to a monitor in the operating room to help the surgeon see inside your body.  Hollow tubes will be put through the other small incisions in your abdomen. The tools needed for the procedure will be put through these tubes.  The ovary with the cyst will be identified, and the cyst will be removed.  The tools will then be removed, and the incisions will be closed with stitches or skin glue. Bandages (dressings) may be applied to the incisions.   Open abdominal technique  A single, large incision will be made along your bikini line or in the middle of your lower abdomen.  The ovary with the cyst will be identified, and the cyst will be removed.  The incision will then be closed with stitches or staples.  Bandages (dressings) may be applied to the incisions. The procedure may vary among health care providers and hospitals. What happens after the procedure?  Your blood pressure, heart rate, breathing rate, and blood oxygen level will be monitored until you leave the hospital or clinic.  Your IV will be removed after you are able to eat and drink well.  You may be given medicine for pain or to help you sleep.  You may be given an antibiotic medicine.  Do not drive for 24 hours if you were given a sedative during the procedure.  The cyst that was removed will be sent to the lab for testing. If the cyst has cancer cells, both ovaries may  need to be removed during a different surgery.  It is up to you to get the results of your procedure. Ask your health care provider, or the department that is doing the procedure, when your results will be ready. Summary  Ovarian cystectomy is a procedure that is done to remove a cyst on an ovary.  This procedure may be done for cysts that are large, cause symptoms, or do not go away on their own. It may also be done for a cyst that is cancerous or might be cancerous.  Follow instructions from your health care provider about eating and drinking before the procedure.  After the cyst is removed, it will be sent to the lab for testing. This information is not intended to replace advice given to you by your health care provider. Make sure you discuss any questions you have with your health care provider. Document Revised: 03/16/2020 Document Reviewed: 03/16/2020 Elsevier Patient Education  2021 [employer].

## 2021-03-09 NOTE — H&P (View-Only) (Signed)
Patient ID: Wanda Terry, adult   DOB: Nov 04, 2000, 20 y.o.    MRN: [id number]  Reason for Consult: No chief complaint on file.   Referred by No ref. provider found  Subjective:     HPI:  Wanda Terry is a 20 y.o.  adult. They are following up today regarding a right ovarian dermoid. This was first seen in the fall of September 2021 when they were evaluated for pelvic pain. Recent pelvic ultrasound showed the continued presence of the dermoid cyst and surgery for removal is planned for next week.   Gynecological History  Patient's last menstrual period was 02/02/2021.   Past Medical History:  Diagnosis Date  . Gastroparesis    Family History  Problem Relation Age of Onset  . Diabetes Maternal Grandfather   . Heart disease Maternal Grandfather    History reviewed. No pertinent surgical history.  Short Social History:  Social History   Tobacco Use  . Smoking status: Never Smoker  . Smokeless tobacco: Never Used  Substance Use Topics  . Alcohol use: Not Currently    Allergies  Allergen Reactions  . Amoxicillin Anaphylaxis  . Other Anaphylaxis    Peanuts and tree nuts    Current Outpatient Medications  Medication Sig Dispense Refill  . albuterol (VENTOLIN HFA) 108 (90 Base) MCG/ACT inhaler Inhale 2 puffs into the lungs every 6 (six) hours as needed for wheezing or shortness of breath.    . EPINEPHrine 0.3 mg/0.3 mL IJ SOAJ injection Inject 0.3 mg into the muscle as needed for anaphylaxis.    Marland Kitchen ibuprofen (ADVIL) 200 MG tablet Take 200 mg by mouth every 6 (six) hours as needed for headache or mild pain.    Marland Kitchen lamoTRIgine (LAMICTAL) 150 MG tablet Take 300 mg by mouth daily.    Marland Kitchen loratadine (CLARITIN) 10 MG tablet Take 10 mg by mouth daily.    . Melatonin 5 MG CAPS Take 5 mg by mouth at bedtime as needed (sleep).    . Peppermint Oil (IBGARD PO) Take 2-3 tablets by mouth daily as needed (stomach pain).    Marland Kitchen sertraline (ZOLOFT) 100 MG tablet Take 200 mg by mouth  daily.     No current facility-administered medications for this visit.    Review of Systems  Constitutional: Negative for chills, fatigue, fever and unexpected weight change.  HENT: Negative for trouble swallowing.  Eyes: Negative for loss of vision.  Respiratory: Negative for cough, shortness of breath and wheezing.  Cardiovascular: Negative for chest pain, leg swelling, palpitations and syncope.  GI: Negative for abdominal pain, blood in stool, diarrhea, nausea and vomiting.  GU: Negative for difficulty urinating, dysuria, frequency and hematuria.  Musculoskeletal: Negative for back pain, leg pain and joint pain.  Skin: Negative for rash.  Neurological: Negative for dizziness, headaches, light-headedness, numbness and seizures.  Psychiatric: Negative for behavioral problem, confusion, depressed mood and sleep disturbance.        Objective:  Objective   Vitals:   03/09/21 1349  BP: 114/70  Weight: 202 lb 6.4 oz (91.8 kg)  Height: 5\' 5"  (1.651 m)   Body mass index is 33.68 kg/m.  Physical Exam Vitals and nursing note reviewed. Exam conducted with a chaperone present.  Constitutional:      Appearance: Normal appearance.  HENT:     Head: Normocephalic and atraumatic.  Eyes:     Extraocular Movements: Extraocular movements intact.     Pupils: Pupils are equal, round, and reactive to light.  Cardiovascular:  Rate and Rhythm: Normal rate and regular rhythm.  Pulmonary:     Effort: Pulmonary effort is normal.     Breath sounds: Normal breath sounds.  Abdominal:     General: Abdomen is flat.     Palpations: Abdomen is soft.  Musculoskeletal:     Cervical back: Normal range of motion.  Skin:    General: Skin is warm and dry.  Neurological:     General: No focal deficit present.     Mental Status: He is alert and oriented to person, place, and time.  Psychiatric:        Behavior: Behavior normal.        Thought Content: Thought content normal.        Judgment:  Judgment normal.     Assessment/Plan:     20 yo with a right ovarian dermoid Reviewed the plan for Xi- robot assisted laparoscopic right ovarian cystectomy.  We discussed anticipated surgical recovery and risks associated to surgery which include but are not limited to infection, risk of damage to surrounding abdominal and pelvic organs, and bleeding which could require transfusion. We discussed anticipated recovery at home and postoperative care. All questions were answered. Surgical consents were signed.   More than 20 minutes were spent face to face with the patient in the room, reviewing the medical record, labs and images, and coordinating care for the patient. The plan of management was discussed in detail and counseling was provided.   Adrian Prows MD [facility], [facility] 03/09/2021 2:40 PM

## 2021-03-12 ENCOUNTER — Encounter
Admission: RE | Admit: 2021-03-12 | Discharge: 2021-03-12 | Disposition: A | Discharge status: Home / Self Care | Payer: BC Managed Care – PPO | Source: Ambulatory Visit | Attending: Obstetrics and Gynecology | Admitting: Obstetrics and Gynecology

## 2021-03-12 ENCOUNTER — Other Ambulatory Visit: Payer: Self-pay

## 2021-03-12 HISTORY — DX: Unspecified asthma, uncomplicated: J45.909

## 2021-03-12 HISTORY — DX: Slow transit constipation: K59.01

## 2021-03-12 HISTORY — DX: Depression, unspecified: F32.A

## 2021-03-12 NOTE — Patient Instructions (Addendum)
INSTRUCTIONS FOR SURGERY     Your surgery is scheduled for:    Thursday, MAY 26TH     To find out your arrival time for the day of surgery,          please call [phone] between 1 pm and 3 pm on :  Wednesday, MAY 25TH     When you arrive for surgery, report to the [facility]. ONCE THEY HAVE COMPLETED THEIR PROCESS, PROCEED TO THE SECOND FLOOR AND SIGN IN AT THE SURGERY DESK.   REMEMBER: Instructions that are not followed completely may result in serious medical risk,  up to and including death, or upon the discretion of your surgeon and anesthesiologist,            your surgery may need to be rescheduled.  __X__ 1. Do not eat food after midnight the night before your procedure.                    No gum, candy, lozenger, tic tacs, tums or hard candies.                  ABSOLUTELY NOTHING SOLID IN YOUR MOUTH AFTER MIDNIGHT                    You may drink unlimited clear liquids up to 2 hours before you are scheduled to arrive for surgery.                   Do not drink anything within those 2 hours unless you need to take medicine, then take the                   smallest amount you need.  Clear liquids include:  water, apple juice without pulp,                   any flavor Gatorade, Black coffee, black tea.  Sugar may be added but no dairy/ honey /lemon.                        Broth and jello is not considered a clear liquid.  __x__  2. On the morning of surgery, please brush your teeth with toothpaste and water. You may rinse with                  mouthwash if you wish but DO NOT SWALLOW TOOTHPASTE OR MOUTHWASH  __X___3. NO alcohol for 24 hours before or after surgery.  __x___ 4.  Do NOT smoke or use e-cigarettes for 24 HOURS PRIOR TO SURGERY.                      DO NOT use any chewable tobacco products for at least 6 hours prior to surgery.  __x___ 5. If you start any new  medication after this appointment and prior to surgery, please                   Bring it with you on the day of surgery.  ___x__ 6. Notify  your doctor if there is any change in your medical condition, such as fever,                   infection, vomitting,diarrhea or any open sores.  __x___ 7.  USE the CHG SOAP as instructed, the night before surgery and the day of surgery.                   Once you have washed with this soap, do NOT use any of the following: Powders, perfumes                    or lotions. Please do not wear make up, hairpins, clips or nail polish. You may wear deodorant.                                                      Women need to shave 48 hours prior to surgery.                   DO NOT wear ANY jewelry on the day of surgery. If there are rings that are too tight to                    remove easily, please address this prior to the surgery day. Piercings need to be removed.                                                                     NO METAL ON YOUR BODY.                   Do NOT bring any valuables.  If you came to Pre-Admit testing then you will not need license,                    insurance card or credit card.  If you will be staying overnight, please either leave your things in                    the car or have your family be responsible for these items.                    [facility] IS NOT RESPONSIBLE FOR BELONGINGS OR VALUABLES.  ___X__ 8. DO NOT wear contact lenses on surgery day.  You may not have dentures,                     Hearing aides, contacts or glasses in the operating room. These items can be                    Placed in the Recovery Room to receive immediately after surgery.  __x___ 9. IF YOU ARE SCHEDULED TO GO HOME ON THE SAME DAY, YOU MUST                   Have someone to drive you home and to stay with you  for the first 24 hours.  Have an arrangement prior to arriving on surgery day.  ___x__ 10. Take the  following medications on the morning of surgery with a sip of water:                              1. Inhaler if you feel like you need it.                     2.                       __x___ 11.  Follow any instructions provided to you by your surgeon.                        Such as enema, clear liquid bowel prep                   ##PLEASE DRINK ALL OF THE PRESURGICAL CARBOHYDRATE DRINK                ON THE MORNING OF SURGERY. HAVE IT FINISHED BY 2 HOURS PRIOR TO                       SURGERY.##  __X__  12. STOP ALL ASPIRIN PRODUCTS AS OF TODAY, MAY 23RD                       THIS INCLUDES BC POWDERS / GOODIES POWDER  __x___ 13. STOP Anti-inflammatories as of TODAY, MAY 23RD.                      This includes IBUPROFEN / MOTRIN / ADVIL / ALEVE/ NAPROXYN                    YOU MAY TAKE TYLENOL ANY TIME PRIOR TO SURGERY.  __X___ 39.  Stop supplements until after surgery.                     This includes: PEPPERMINT OIL  ___X___17.  Continue to take YOUR EVENING MEDICATIONS AS USUAL.  __X____18.    Wear clean and comfortable clothing to the hospital. HAVE SOMETHING THAT IS SOFT/STRETCHY OVER YOUR ABDOMEN.  BRING PHONE NUMBERS FOR CONTACT PEOPLE.  YOU AND YOUR MOM NEED A MASK WHILE IN THE HOSPITAL.  GOOD LUCK!!   :):):)

## 2021-03-13 ENCOUNTER — Encounter
Admission: RE | Admit: 2021-03-13 | Discharge: 2021-03-13 | Disposition: A | Discharge status: Home / Self Care | Payer: BC Managed Care – PPO | Source: Ambulatory Visit | Attending: Obstetrics and Gynecology | Admitting: Obstetrics and Gynecology

## 2021-03-13 ENCOUNTER — Other Ambulatory Visit: Admission: RE | Admit: 2021-03-13 | Payer: BC Managed Care – PPO | Source: Ambulatory Visit

## 2021-03-13 DIAGNOSIS — Z01812 Encounter for preprocedural laboratory examination: Secondary | ICD-10-CM | POA: Insufficient documentation

## 2021-03-13 DIAGNOSIS — D270 Benign neoplasm of right ovary: Secondary | ICD-10-CM | POA: Diagnosis present

## 2021-03-13 DIAGNOSIS — Z880 Allergy status to penicillin: Secondary | ICD-10-CM | POA: Diagnosis not present

## 2021-03-13 DIAGNOSIS — Z79899 Other long term (current) drug therapy: Secondary | ICD-10-CM | POA: Diagnosis not present

## 2021-03-13 LAB — CBC
HCT: 35.6 % — ABNORMAL LOW (ref 36.0–46.0)
Hemoglobin: 12.4 g/dL (ref 12.0–15.0)
MCH: 31.7 pg (ref 26.0–34.0)
MCHC: 34.8 g/dL (ref 30.0–36.0)
MCV: 91 fL (ref 80.0–100.0)
Platelets: 279 10*3/uL (ref 150–400)
RBC: 3.91 MIL/uL (ref 3.87–5.11)
RDW: 11.5 % (ref 11.5–15.5)
WBC: 7.6 10*3/uL (ref 4.0–10.5)
nRBC: 0 % (ref 0.0–0.2)

## 2021-03-13 LAB — TYPE AND SCREEN
ABO/RH(D): A POS
Antibody Screen: NEGATIVE

## 2021-03-14 MED ORDER — FAMOTIDINE 20 MG PO TABS: 20.0000 mg | ORAL_TABLET | Freq: Once | ORAL | Status: AC

## 2021-03-14 MED ORDER — LACTATED RINGERS IV SOLN: INTRAVENOUS | Status: DC

## 2021-03-14 MED ORDER — CHLORHEXIDINE GLUCONATE 0.12 % MT SOLN: 15.0000 mL | Freq: Once | OROMUCOSAL | Status: AC

## 2021-03-14 MED ORDER — ORAL CARE MOUTH RINSE: 15.0000 mL | Freq: Once | OROMUCOSAL | Status: AC

## 2021-03-14 MED ORDER — POVIDONE-IODINE 10 % EX SWAB
2.0000 "application " | Freq: Once | CUTANEOUS | Status: AC
  Administered 2021-03-15: 2 via TOPICAL

## 2021-03-15 ENCOUNTER — Ambulatory Visit: Payer: BC Managed Care – PPO | Admitting: Certified Registered"

## 2021-03-15 ENCOUNTER — Ambulatory Visit
Admission: RE | Admit: 2021-03-15 | Discharge: 2021-03-15 | Disposition: A | Discharge status: Home / Self Care | Payer: BC Managed Care – PPO | Attending: Obstetrics and Gynecology | Admitting: Obstetrics and Gynecology

## 2021-03-15 ENCOUNTER — Encounter
Admission: RE | Disposition: A | Discharge status: Home / Self Care | Payer: Self-pay | Source: Home / Self Care | Attending: Obstetrics and Gynecology

## 2021-03-15 ENCOUNTER — Encounter: Payer: Self-pay | Admitting: Obstetrics and Gynecology

## 2021-03-15 ENCOUNTER — Other Ambulatory Visit: Payer: Self-pay

## 2021-03-15 DIAGNOSIS — D270 Benign neoplasm of right ovary: Secondary | ICD-10-CM | POA: Diagnosis not present

## 2021-03-15 DIAGNOSIS — D3911 Neoplasm of uncertain behavior of right ovary: Secondary | ICD-10-CM

## 2021-03-15 DIAGNOSIS — Z79899 Other long term (current) drug therapy: Secondary | ICD-10-CM | POA: Insufficient documentation

## 2021-03-15 DIAGNOSIS — Z880 Allergy status to penicillin: Secondary | ICD-10-CM | POA: Insufficient documentation

## 2021-03-15 HISTORY — PX: ROBOTIC ASSISTED LAPAROSCOPIC OVARIAN CYSTECTOMY: SHX6081

## 2021-03-15 LAB — POCT PREGNANCY, URINE: Preg Test, Ur: NEGATIVE

## 2021-03-15 LAB — ABO/RH: ABO/RH(D): A POS

## 2021-03-15 SURGERY — EXCISION, CYST, OVARY, ROBOT-ASSISTED, LAPAROSCOPIC
Anesthesia: General | Laterality: Right

## 2021-03-15 MED ORDER — DEXMEDETOMIDINE (PRECEDEX) IN NS 20 MCG/5ML (4 MCG/ML) IV SYRINGE: PREFILLED_SYRINGE | INTRAVENOUS | Status: DC | PRN

## 2021-03-15 MED ORDER — SUGAMMADEX SODIUM 200 MG/2ML IV SOLN
INTRAVENOUS | Status: DC | PRN
  Administered 2021-03-15: 200 mg via INTRAVENOUS

## 2021-03-15 MED ORDER — MIDAZOLAM HCL 2 MG/2ML IJ SOLN
INTRAMUSCULAR | Status: DC | PRN
  Administered 2021-03-15: 2 mg via INTRAVENOUS

## 2021-03-15 MED ORDER — MIDAZOLAM HCL 2 MG/2ML IJ SOLN
INTRAMUSCULAR | Status: AC
  Filled 2021-03-15: qty 2

## 2021-03-15 MED ORDER — FENTANYL CITRATE (PF) 100 MCG/2ML IJ SOLN
INTRAMUSCULAR | Status: AC
  Filled 2021-03-15: qty 2

## 2021-03-15 MED ORDER — IBUPROFEN 600 MG PO TABS: 600.0000 mg | ORAL_TABLET | Freq: Four times a day (QID) | ORAL | 0 refills | Status: DC | PRN

## 2021-03-15 MED ORDER — FENTANYL CITRATE (PF) 100 MCG/2ML IJ SOLN
INTRAMUSCULAR | Status: DC | PRN
  Administered 2021-03-15 (×2): 50 ug via INTRAVENOUS

## 2021-03-15 MED ORDER — ACETAMINOPHEN 500 MG PO TABS: 1000.0000 mg | ORAL_TABLET | Freq: Four times a day (QID) | ORAL | 0 refills | Status: DC | PRN

## 2021-03-15 MED ORDER — ONDANSETRON HCL 4 MG/2ML IJ SOLN: 4.0000 mg | Freq: Once | INTRAMUSCULAR | Status: DC | PRN

## 2021-03-15 MED ORDER — ACETAMINOPHEN 10 MG/ML IV SOLN
INTRAVENOUS | Status: AC
  Filled 2021-03-15: qty 100

## 2021-03-15 MED ORDER — MEPERIDINE HCL 25 MG/ML IJ SOLN: 6.2500 mg | INTRAMUSCULAR | Status: DC | PRN

## 2021-03-15 MED ORDER — DEXMEDETOMIDINE (PRECEDEX) IN NS 20 MCG/5ML (4 MCG/ML) IV SYRINGE
PREFILLED_SYRINGE | INTRAVENOUS | Status: DC | PRN
  Administered 2021-03-15: 4 ug via INTRAVENOUS
  Administered 2021-03-15: 8 ug via INTRAVENOUS
  Administered 2021-03-15 (×2): 4 ug via INTRAVENOUS

## 2021-03-15 MED ORDER — PHENYLEPHRINE HCL (PRESSORS) 10 MG/ML IV SOLN
INTRAVENOUS | Status: DC | PRN
  Administered 2021-03-15: 100 ug via INTRAVENOUS

## 2021-03-15 MED ORDER — FAMOTIDINE 20 MG PO TABS
ORAL_TABLET | ORAL | Status: AC
  Administered 2021-03-15: 20 mg via ORAL
  Filled 2021-03-15: qty 1

## 2021-03-15 MED ORDER — ONDANSETRON HCL 4 MG/2ML IJ SOLN
INTRAMUSCULAR | Status: DC | PRN
  Administered 2021-03-15: 4 mg via INTRAVENOUS

## 2021-03-15 MED ORDER — ROCURONIUM BROMIDE 10 MG/ML (PF) SYRINGE
PREFILLED_SYRINGE | INTRAVENOUS | Status: AC
  Filled 2021-03-15: qty 10

## 2021-03-15 MED ORDER — DEXAMETHASONE SODIUM PHOSPHATE 10 MG/ML IJ SOLN
INTRAMUSCULAR | Status: DC | PRN
  Administered 2021-03-15: 10 mg via INTRAVENOUS

## 2021-03-15 MED ORDER — FENTANYL CITRATE (PF) 100 MCG/2ML IJ SOLN
25.0000 ug | INTRAMUSCULAR | Status: DC | PRN
  Administered 2021-03-15: 50 ug via INTRAVENOUS
  Administered 2021-03-15 (×2): 25 ug via INTRAVENOUS

## 2021-03-15 MED ORDER — LIDOCAINE HCL (CARDIAC) PF 100 MG/5ML IV SOSY
PREFILLED_SYRINGE | INTRAVENOUS | Status: DC | PRN
  Administered 2021-03-15: 80 mg via INTRAVENOUS

## 2021-03-15 MED ORDER — BUPIVACAINE LIPOSOME 1.3 % IJ SUSP
INTRAMUSCULAR | Status: AC
  Filled 2021-03-15: qty 20

## 2021-03-15 MED ORDER — KETOROLAC TROMETHAMINE 30 MG/ML IJ SOLN
INTRAMUSCULAR | Status: AC
  Filled 2021-03-15: qty 1

## 2021-03-15 MED ORDER — ACETAMINOPHEN 10 MG/ML IV SOLN
INTRAVENOUS | Status: DC | PRN
  Administered 2021-03-15: 1000 mg via INTRAVENOUS

## 2021-03-15 MED ORDER — OXYCODONE HCL 5 MG PO TABS
ORAL_TABLET | ORAL | Status: AC
  Filled 2021-03-15: qty 1

## 2021-03-15 MED ORDER — OXYCODONE HCL 5 MG PO TABS
5.0000 mg | ORAL_TABLET | Freq: Once | ORAL | Status: AC
Start: 2021-03-15 — End: 2021-03-15
  Administered 2021-03-15: 5 mg via ORAL

## 2021-03-15 MED ORDER — OXYCODONE HCL 5 MG PO TABS: 5.0000 mg | ORAL_TABLET | Freq: Three times a day (TID) | ORAL | 0 refills | Status: DC | PRN

## 2021-03-15 MED ORDER — PROPOFOL 10 MG/ML IV BOLUS
INTRAVENOUS | Status: DC | PRN
  Administered 2021-03-15: 200 mg via INTRAVENOUS

## 2021-03-15 MED ORDER — DEXMEDETOMIDINE (PRECEDEX) IN NS 20 MCG/5ML (4 MCG/ML) IV SYRINGE
PREFILLED_SYRINGE | INTRAVENOUS | Status: AC
  Filled 2021-03-15: qty 5

## 2021-03-15 MED ORDER — BUPIVACAINE LIPOSOME 1.3 % IJ SUSP
INTRAMUSCULAR | Status: DC | PRN
  Administered 2021-03-15: 19 mL

## 2021-03-15 MED ORDER — EPHEDRINE 5 MG/ML INJ
INTRAVENOUS | Status: AC
  Filled 2021-03-15: qty 10

## 2021-03-15 MED ORDER — CHLORHEXIDINE GLUCONATE 0.12 % MT SOLN
OROMUCOSAL | Status: AC
  Administered 2021-03-15: 15 mL via OROMUCOSAL
  Filled 2021-03-15: qty 15

## 2021-03-15 MED ORDER — ROCURONIUM BROMIDE 100 MG/10ML IV SOLN
INTRAVENOUS | Status: DC | PRN
  Administered 2021-03-15: 50 mg via INTRAVENOUS
  Administered 2021-03-15: 20 mg via INTRAVENOUS

## 2021-03-15 MED ORDER — KETOROLAC TROMETHAMINE 30 MG/ML IJ SOLN
INTRAMUSCULAR | Status: DC | PRN
  Administered 2021-03-15: 30 mg via INTRAVENOUS

## 2021-03-15 SURGICAL SUPPLY — 58 items
ADH SKN CLS APL DERMABOND .7 (GAUZE/BANDAGES/DRESSINGS) ×1
APL PRP STRL LF DISP 70% ISPRP (MISCELLANEOUS) ×1
APL SRG 38 LTWT LNG FL B (MISCELLANEOUS) ×1
APPLICATOR ARISTA FLEXITIP XL (MISCELLANEOUS) ×1 IMPLANT
BAG DRN RND TRDRP ANRFLXCHMBR (UROLOGICAL SUPPLIES) ×1
BAG URINE DRAIN 2000ML AR STRL (UROLOGICAL SUPPLIES) ×2 IMPLANT
BASIN GRAD PLASTIC 32OZ STRL (MISCELLANEOUS) ×2 IMPLANT
BLADE SURG SZ10 CARB STEEL (BLADE) ×2 IMPLANT
BLADE SURG SZ11 CARB STEEL (BLADE) ×2 IMPLANT
CANNULA REDUC XI 12-8 STAPL (CANNULA) ×1
CANNULA REDUCER 12-8 DVNC XI (CANNULA) ×1 IMPLANT
CATH FOLEY 2WAY  5CC 16FR (CATHETERS) ×1
CATH FOLEY 2WAY 5CC 16FR (CATHETERS) ×1
CATH URTH 16FR FL 2W BLN LF (CATHETERS) ×1 IMPLANT
CHLORAPREP W/TINT 26 (MISCELLANEOUS) ×2 IMPLANT
COVER TIP SHEARS 8 DVNC (MISCELLANEOUS) IMPLANT
COVER TIP SHEARS 8MM DA VINCI (MISCELLANEOUS) ×1
COVER WAND RF STERILE (DRAPES) ×3 IMPLANT
DEFOGGER SCOPE WARMER CLEARIFY (MISCELLANEOUS) ×2 IMPLANT
DERMABOND ADVANCED (GAUZE/BANDAGES/DRESSINGS) ×1
DERMABOND ADVANCED .7 DNX12 (GAUZE/BANDAGES/DRESSINGS) ×1 IMPLANT
DRAPE 3/4 80X56 (DRAPES) ×4 IMPLANT
DRAPE ARM DVNC X/XI (DISPOSABLE) ×3 IMPLANT
DRAPE COLUMN DVNC XI (DISPOSABLE) ×1 IMPLANT
DRAPE DA VINCI XI ARM (DISPOSABLE) ×4
DRAPE DA VINCI XI COLUMN (DISPOSABLE) ×1
DRAPE UNDER BUTTOCK W/FLU (DRAPES) ×2 IMPLANT
DRSG TEGADERM 2-3/8X2-3/4 SM (GAUZE/BANDAGES/DRESSINGS) ×8 IMPLANT
ELECT REM PT RETURN 9FT ADLT (ELECTROSURGICAL) ×2
ELECTRODE REM PT RTRN 9FT ADLT (ELECTROSURGICAL) ×1 IMPLANT
GAUZE 4X4 16PLY RFD (DISPOSABLE) ×2 IMPLANT
GLOVE SURG ENC MOIS LTX SZ7 (GLOVE) ×4 IMPLANT
GLOVE SURG UNDER POLY LF SZ7.5 (GLOVE) ×4 IMPLANT
GOWN STRL REUS W/ TWL LRG LVL3 (GOWN DISPOSABLE) ×8 IMPLANT
GOWN STRL REUS W/TWL LRG LVL3 (GOWN DISPOSABLE) ×16
GRASPER SUT TROCAR 14GX15 (MISCELLANEOUS) ×1 IMPLANT
HEMOSTAT ARISTA ABSORB 3G PWDR (HEMOSTASIS) ×1 IMPLANT
KIT PINK PAD W/HEAD ARE REST (MISCELLANEOUS) ×2
KIT PINK PAD W/HEAD ARM REST (MISCELLANEOUS) ×1 IMPLANT
LABEL OR SOLS (LABEL) ×2 IMPLANT
MANIPULATOR UTERINE 4.5 ZUMI (MISCELLANEOUS) ×1 IMPLANT
NEEDLE HYPO 22GX1.5 SAFETY (NEEDLE) ×2 IMPLANT
NS IRRIG 1000ML POUR BTL (IV SOLUTION) ×3 IMPLANT
PACK GYN LAPAROSCOPIC (MISCELLANEOUS) ×2 IMPLANT
PAD ARMBOARD 7.5X6 YLW CONV (MISCELLANEOUS) ×2 IMPLANT
PAD OB MATERNITY 4.3X12.25 (PERSONAL CARE ITEMS) ×2 IMPLANT
PAD PREP 24X41 OB/GYN DISP (PERSONAL CARE ITEMS) ×2 IMPLANT
SEAL CANN UNIV 5-8 DVNC XI (MISCELLANEOUS) ×3 IMPLANT
SEAL XI 5MM-8MM UNIVERSAL (MISCELLANEOUS) ×3
SOLUTION ELECTROLUBE (MISCELLANEOUS) ×2 IMPLANT
SPONGE GAUZE 2X2 8PLY STRL LF (GAUZE/BANDAGES/DRESSINGS) ×6 IMPLANT
SURGILUBE 2OZ TUBE FLIPTOP (MISCELLANEOUS) ×2 IMPLANT
SUT MNCRL 4-0 (SUTURE) ×2
SUT MNCRL 4-0 27XMFL (SUTURE) ×1
SUT VICRYL 0 UR6 27IN ABS (SUTURE) ×1 IMPLANT
SUTURE MNCRL 4-0 27XMF (SUTURE) ×1 IMPLANT
SYR 10ML LL (SYRINGE) ×3 IMPLANT
TROCAR XCEL NON-BLD 5MMX100MML (ENDOMECHANICALS) ×2 IMPLANT

## 2021-03-15 NOTE — Anesthesia Preprocedure Evaluation (Signed)
Anesthesia Evaluation  Patient identified by MRN, date of birth, ID band Patient awake    Reviewed: Allergy & Precautions, NPO status , Patient's Chart, lab work & pertinent test results  Airway Mallampati: II  TM Distance: >3 FB Neck ROM: Full    Dental no notable dental hx.    Pulmonary asthma ,    Pulmonary exam normal        Cardiovascular negative cardio ROS Normal cardiovascular exam     Neuro/Psych PSYCHIATRIC DISORDERS Depression negative neurological ROS     GI/Hepatic negative GI ROS, Neg liver ROS,   Endo/Other  negative endocrine ROS  Renal/GU negative Renal ROS  negative genitourinary   Musculoskeletal negative musculoskeletal ROS (+)   Abdominal (+) + obese,   Peds negative pediatric ROS (+)  Hematology negative hematology ROS (+)   Anesthesia Other Findings Ovarian Cyst  Reproductive/Obstetrics negative OB ROS                            Anesthesia Physical Anesthesia Plan  ASA: II  Anesthesia Plan: General   Post-op Pain Management:    Induction:   PONV Risk Score and Plan: 3 and Propofol infusion, Ondansetron and Midazolam  Airway Management Planned: Oral ETT  Additional Equipment:   Intra-op Plan:   Post-operative Plan: Extubation in OR  Informed Consent: I have reviewed the patients History and Physical, chart, labs and discussed the procedure including the risks, benefits and alternatives for the proposed anesthesia with the patient or authorized representative who has indicated his/her understanding and acceptance.       Plan Discussed with: CRNA, Anesthesiologist and Surgeon  Anesthesia Plan Comments:         Anesthesia Quick Evaluation

## 2021-03-15 NOTE — Interval H&P Note (Signed)
History and Physical Interval Note:  03/15/2021 1:59 PM  Wanda Terry  has presented today for surgery, with the diagnosis of right ovarian cyst.  The various methods of treatment have been discussed with the patient and family. After consideration of risks, benefits and other options for treatment, the patient has consented to  Procedure(s): XI ROBOTIC [place] (Right) as a surgical intervention.  The patient's history has been reviewed, patient examined, no change in status, stable for surgery.  I have reviewed the patient's chart and labs.  Questions were answered to the patient's satisfaction.     [place]

## 2021-03-15 NOTE — Anesthesia Procedure Notes (Signed)
Procedure Name: Intubation Date/Time: 03/15/2021 2:57 PM Performed by: Daiva Huge, RN Pre-anesthesia Checklist: Patient identified, Emergency Drugs available, Suction available and Patient being monitored Patient Re-evaluated:Patient Re-evaluated prior to induction Oxygen Delivery Method: Circle system utilized Preoxygenation: Pre-oxygenation with 100% oxygen Induction Type: IV induction Ventilation: Mask ventilation without difficulty Laryngoscope Size: McGraph and 3 Grade View: Grade I Tube type: Oral Tube size: 6.5 mm Number of attempts: 1 Airway Equipment and Method: Stylet and Oral airway Placement Confirmation: ETT inserted through vocal cords under direct vision,  positive ETCO2 and breath sounds checked- equal and bilateral Secured at: 21 cm Tube secured with: Tape Dental Injury: Teeth and Oropharynx as per pre-operative assessment

## 2021-03-15 NOTE — Transfer of Care (Signed)
Immediate Anesthesia Transfer of Care Note  Patient: Wanda Terry  Procedure(s) Performed: XI ROBOTIC ASSISTED LAPAROSCOPIC OVARIAN CYSTECTOMY (Right )  Patient Location: PACU  Anesthesia Type:General  Level of Consciousness: drowsy and patient cooperative  Airway & Oxygen Therapy: Patient Spontanous Breathing and Patient connected to face mask oxygen  Post-op Assessment: Report given to RN and Post -op Vital signs reviewed and stable  Post vital signs: Reviewed and stable  Last Vitals:  Vitals Value Taken Time  BP 106/66 03/15/21 1648  Temp    Pulse 79 03/15/21 1651  Resp 16 03/15/21 1651  SpO2 100 % 03/15/21 1651  Vitals shown include unvalidated device data.  Last Pain:  Vitals:   03/15/21 1412  TempSrc: Temporal  PainSc: 3          Complications: No complications documented.

## 2021-03-15 NOTE — Discharge Instructions (Signed)
Ovarian Cystectomy, Care After This sheet gives you information about how to care for yourself after your procedure. Your health care provider may also give you more specific instructions. If you have problems or questions, contact your health care provider. What can I expect after the procedure? After the procedure, it is common to have:  Pain in the abdomen, especially at the incision areas. You will be given pain medicine to control the pain.  Tiredness. This is a normal part of the recovery process. Your energy level will return to normal over the next several weeks. Follow these instructions at home: Medicines  Take over-the-counter and prescription medicines only as told by your health care provider.  If you were prescribed an antibiotic medicine, use it as told by your health care provider. Do not stop using the antibiotic even if you start to feel better.  Do not take aspirin because it can cause bleeding.  Ask your health care provider if the medicine prescribed to you: ? Requires you to avoid driving or using heavy machinery. ? Can cause constipation. You may need to take these actions to prevent or treat constipation:  Drink enough fluid to keep your urine pale yellow.  Take over-the-counter or prescription medicines.  Eat foods that are high in fiber, such as beans, whole grains, and fresh fruits and vegetables.  Limit foods that are high in fat and processed sugars, such as fried or sweet foods. Incision care  Follow instructions from your health care provider about how to take care of your incisions. Make sure you: ? Wash your hands with soap and water for at least 20 seconds before and after you change your bandage (dressing). If soap and water are not available, use hand sanitizer. ? Change your dressing as told by your health care provider. ? Leave stitches (sutures), skin glue, or adhesive strips in place. These skin closures may need to stay in place for 2 weeks or  longer. If adhesive strip edges start to loosen and curl up, you may trim the loose edges. Do not remove adhesive strips completely unless your health care provider tells you to do that.  Check your incision areas every day for signs of infection. Check for: ? More redness, swelling, or pain. ? Fluid or blood. ? Warmth. ? Pus or a bad smell.  Do not take baths, swim, or use a hot tub until your health care provider approves. Ask your health care provider if you may take showers. You may only be allowed to take sponge baths.   Activity  Rest as told by your health care provider.  Avoid sitting for a long time without moving. Get up to take short walks every 1-2 hours. This is important to improve blood flow and breathing. Ask for help if you feel weak or unsteady.  Do not lift anything that is heavier than 10 lb (4.5 kg), or the limit that you are told, until your health care provider says that it is safe.  Return to your normal activities and diet as told by your health care provider. Ask your health care provider what activities are safe for you. General instructions  Do not douche, use tampons, or have sex until your health care provider says it is okay.  Do not use any products that contain nicotine or tobacco, such as cigarettes, e-cigarettes, and chewing tobacco. These can delay incision healing after surgery. If you need help quitting, ask your health care provider.  Keep all follow-up visits. This  is important. Contact a health care provider if:  You have a fever or chills.  You feel nauseous or you vomit.  You have pain when you urinate or have blood in your urine.  You have a rash on your body.  You have pain or redness where the IV was inserted.  You have pain that is not relieved with medicine.  You have any of these signs of infection: ? More redness, swelling, or pain around an incision. ? Fluid or blood coming from an incision. ? Warmth coming from an  incision. ? Pus or a bad smell coming from an incision. Get help right away if:  You have chest pain or shortness of breath.  You feel dizzy or light-headed.  You have heavy bleeding.  You have increasing abdominal pain that is not relieved with medicine.  You have pain, swelling, or redness in your leg.  Your incision is opening and the edges are not staying together. Summary  After the procedure, it is common to have some pain in your abdomen. You will be given pain medicine to control the pain.  Follow instructions from your health care provider about how to take care of your incisions.  Do not douche, use tampons, or have sex until your health care provider says it okay.  Keep all follow-up visits. This is important. This information is not intended to replace advice given to you by your health care provider. Make sure you discuss any questions you have with your health care provider. Document Revised: 03/16/2020 Document Reviewed: 03/16/2020 Elsevier Patient Education  2021 [place]   1) The drugs that you were given will stay in your system until tomorrow so for the next 24 hours you should not:  A) Drive an automobile B) Make any legal decisions C) Drink any alcoholic beverage   2) You may resume regular meals tomorrow.  Today it is better to start with liquids and gradually work up to solid foods.  You may eat anything you prefer, but it is better to start with liquids, then soup and crackers, and gradually work up to solid foods.   3) Please notify your doctor immediately if you have any unusual bleeding, trouble breathing, redness and pain at the surgery site, drainage, fever, or pain not relieved by medication.    4) Additional Instructions:        Please contact your physician with any problems or Same Day Surgery at [phone], Monday through Friday 6 am to 4 pm, or [place] [facility]  number at [phone].

## 2021-03-15 NOTE — Op Note (Signed)
Operative Note   PRE-OP DIAGNOSIS: Right Ovarian Dermoid Cyst   POST-OP DIAGNOSIS: Right Ovarian Dermoid Cyst  SURGEON: Adrian Prows MD  ASSISTANT:  Malachy Mood MD  ANESTHESIA: General  PROCEDURE: Procedure(s):Robotic assisted laparoscopic right ovarian cystectomy  ESTIMATED BLOOD LOSS: 10 cc  DRAINS: Foley  SPECIMENS: Ovarian cyst   COMPLICATIONS: None  DISPOSITION: PACU  CONDITION: Stable  INDICATIONS: Right ovarian dermoid cyst  FINDINGS: Exam under anesthesia revealed an 8 week uterus. There was right adnexal enlargement. The parametria was smooth. The cervix was negative for gross lesions. Intraoperative findings included: The uterus was grossly normal. The right ovary had a dermoid cyst. The left ovary was grossly normal. The upper abdomen was  normal including omentum, bowel, liver, stomach, and diaphragmatic surfaces. There was no evidence of grossly enlarged pelvic or right para-aortic lymph nodes.   PROCEDURE IN DETAIL: After informed consent was obtained, the patient was taken to the operating room where anesthesia was obtained without difficulty. The patient was positioned in the dorsal lithotomy position in [place] and her arms were carefully tucked at her sides and the usual precautions were taken.  She was prepped and draped in normal sterile fashion.  Time-out was performed. A foley catheter was placed. A speculum was placed in the vagina and the cervical os was dilated. The uterus sounded to 8 cm.  A standard zumi uterine manipulator was then placed in the uterus without incident.     Laparoscopic entry was obtained via a umbilical incision and direct entry. The 8 mm robotic optiview port was placed, abdomen insuffulated, and pelvis visualized with noted findings above.  The patient was placed in Trendelenburg and the bowel was displaced up into the upper abdomen.  The 3 additional robotic port were placed in a horizontal line across the upper  abdomen. The umbilical port was exchanged for a 12 mm port. Robotic docking was performed.  The right ovary and dermoid cyst were identified. The ovarian capsule was incised with the monopolar scissors. The [place] forceps and bipolar forceps were used to shell out the dermoid cyst. The cyst was then placed into a laparoscopic bag and removed through the umbilical port. Excellent hemostasis was noted.    The instruments were then all removed from the abdomen. The robot was undocked.  The ovarian cyst was ruptured inside of the laparoscopic bag and removed through the umbilical port. The abdomen was again insufflated. The umbilical fascia was closed with the Carter-Thomason device.  Arista was applied to the surgical bed of the right ovary for hemostasis.   The air was expelled from the abdomen. The trocars were removed. The skin was closed with 4-0 monocryl with a subcuticular stitch and Indermil glue.  The patient tolerated the procedure well.  Sponge, lap and needle counts were correct x2.  The patient was taken to recovery room in excellent condition.  The foley was removed from the bladder. The uterine manipulator was removed from the uterus.    Dr. Georgianne Fick assisted with this case. This was a high level case requiring a Physicist, medical. No other assistant was readily available. He assisted with placement of ports and uterine manipulation during the case.   Adrian Prows MD, [facility], [facility] 03/15/2021 5:02 PM

## 2021-03-16 ENCOUNTER — Encounter: Payer: Self-pay | Admitting: Obstetrics and Gynecology

## 2021-03-20 LAB — SURGICAL PATHOLOGY

## 2021-03-23 ENCOUNTER — Encounter: Payer: Self-pay | Admitting: Obstetrics and Gynecology

## 2021-03-23 ENCOUNTER — Other Ambulatory Visit: Payer: Self-pay

## 2021-03-23 ENCOUNTER — Ambulatory Visit (INDEPENDENT_AMBULATORY_CARE_PROVIDER_SITE_OTHER): Payer: BC Managed Care – PPO | Admitting: Obstetrics and Gynecology

## 2021-03-23 VITALS — BP 120/74 | Ht 65.0 in | Wt 199.6 lb

## 2021-03-23 DIAGNOSIS — Z4889 Encounter for other specified surgical aftercare: Secondary | ICD-10-CM

## 2021-03-23 NOTE — Patient Instructions (Signed)
Nobie Putnam, DO  [phone]  [facility] [place], [place] [address]   Lavon Paganini, MD [phone]  [facility] [address] [place], [place] [address]

## 2021-03-23 NOTE — Progress Notes (Signed)
  Postoperative Follow-up Patient presents post op from  [facility]- assisted Laparoscopic Ovarian Cystectomy  for  Right ovarian dermoid cyst , 1 week ago.  Subjective: Patient reports some improvement in her preop symptoms. Eating a regular diet without difficulty. Pain is controlled without any medications.  Activity: normal activities of daily living. He has been feeling well.   Objective: BP 120/74   Ht 5\' 5"  (1.651 m)   Wt 199 lb 9.6 oz (90.5 kg)   BMI 33.22 kg/m  Physical Exam Constitutional:      Appearance: Normal appearance. He is well-developed.  HENT:     Head: Normocephalic and atraumatic.  Eyes:     Extraocular Movements: Extraocular movements intact.     Pupils: Pupils are equal, round, and reactive to light.  Neck:     Thyroid: No thyromegaly.  Cardiovascular:     Rate and Rhythm: Normal rate and regular rhythm.     Heart sounds: Normal heart sounds.  Pulmonary:     Effort: Pulmonary effort is normal.     Breath sounds: Normal breath sounds.  Abdominal:     General: Bowel sounds are normal. There is no distension.     Palpations: Abdomen is soft. There is no mass.     Comments: Incisions are clean, dry, and well healed. Intact.   Musculoskeletal:     Cervical back: Neck supple.  Neurological:     Mental Status: He is alert and oriented to person, place, and time.  Skin:    General: Skin is warm and dry.  Psychiatric:        Behavior: Behavior normal.        Thought Content: Thought content normal.        Judgment: Judgment normal.  Vitals reviewed.     Assessment: s/p :   [facility]- assisted Laparoscopic Ovarian Cystectomy stable  Plan: Patient has done well after surgery with no apparent complications.  I have discussed the post-operative course to date, and the expected progress moving forward.  The patient understands what complications to be concerned about.  I will see the patient in routine follow up, or sooner if needed.    Activity plan: No  heavy lifting. Pelvic rest.  Follow up in 2 weeks.   Adrian Prows MD, [place], [facility] 03/23/2021 12:35 PM

## 2021-04-02 NOTE — Anesthesia Postprocedure Evaluation (Signed)
Anesthesia Post Note  Patient: Cambry Bordas  Procedure(s) Performed: [facility] ROBOTIC ASSISTED LAPAROSCOPIC OVARIAN CYSTECTOMY (Right)  Patient location during evaluation: PACU Anesthesia Type: General Level of consciousness: awake and alert and oriented Pain management: pain level controlled Vital Signs Assessment: post-procedure vital signs reviewed and stable Respiratory status: spontaneous breathing Cardiovascular status: blood pressure returned to baseline Anesthetic complications: no   No notable events documented.   Last Vitals:  Vitals:   03/15/21 1715 03/15/21 1727  BP: 109/70 109/70  Pulse: 93 85  Resp: 12 13  Temp: 36.7 C 36.8 C  SpO2: 96% 98%    Last Pain:  Vitals:   03/16/21 0835  TempSrc:   PainSc: 5                  [place]

## 2021-04-09 ENCOUNTER — Other Ambulatory Visit: Payer: Self-pay

## 2021-04-09 ENCOUNTER — Ambulatory Visit (INDEPENDENT_AMBULATORY_CARE_PROVIDER_SITE_OTHER): Payer: BC Managed Care – PPO | Admitting: Obstetrics and Gynecology

## 2021-04-09 ENCOUNTER — Encounter: Payer: Self-pay | Admitting: Obstetrics and Gynecology

## 2021-04-09 VITALS — BP 118/70 | Ht 65.0 in | Wt 204.4 lb

## 2021-04-09 DIAGNOSIS — Z4889 Encounter for other specified surgical aftercare: Secondary | ICD-10-CM

## 2021-04-09 DIAGNOSIS — N83201 Unspecified ovarian cyst, right side: Secondary | ICD-10-CM

## 2021-04-09 NOTE — Progress Notes (Signed)
  Postoperative Follow-up Patient presents post op from  [facility]- assisted Laparoscopic Ovarian Cystectomy  for  right ovarian dermoid , 1 week ago.  Subjective: Patient reports marked improvement in her preop symptoms. Eating a regular diet without difficulty. Pain is controlled without any medications.  Activity: normal activities of daily living. Patient reports additional symptom's since surgery of Abd pain.  Objective: BP 118/70   Ht 5\' 5"  (1.651 m)   Wt 204 lb 6.4 oz (92.7 kg)   BMI 34.01 kg/m  Physical Exam Constitutional:      Appearance: Normal appearance. He is well-developed.  HENT:     Head: Normocephalic and atraumatic.  Eyes:     Extraocular Movements: Extraocular movements intact.     Pupils: Pupils are equal, round, and reactive to light.  Neck:     Thyroid: No thyromegaly.  Cardiovascular:     Rate and Rhythm: Normal rate and regular rhythm.     Heart sounds: Normal heart sounds.  Pulmonary:     Effort: Pulmonary effort is normal.     Breath sounds: Normal breath sounds.  Abdominal:     General: Bowel sounds are normal. There is no distension.     Palpations: Abdomen is soft. There is no mass.     Comments: Incisions are clean, dry, and intact  Musculoskeletal:     Cervical back: Neck supple.  Neurological:     Mental Status: He is alert and oriented to person, place, and time.  Skin:    General: Skin is warm and dry.  Psychiatric:        Behavior: Behavior normal.        Thought Content: Thought content normal.        Judgment: Judgment normal.  Vitals reviewed.    Assessment: s/p :  [facility]- assisted Laparoscopic Ovarian Cystectomy stable  Plan: Patient has done well after surgery with no apparent complications.  I have discussed the post-operative course to date, and the expected progress moving forward.  The patient understands what complications to be concerned about.  I will see the patient in routine follow up, or sooner if needed.     Activity plan: No restriction.  Adrian Prows MD, [place], [facility] 04/09/2021 11:53 AM

## 2021-04-09 NOTE — Patient Instructions (Signed)

## 2021-05-08 ENCOUNTER — Encounter: Payer: Self-pay | Admitting: Family Medicine

## 2021-05-08 ENCOUNTER — Other Ambulatory Visit: Payer: Self-pay

## 2021-05-08 ENCOUNTER — Ambulatory Visit: Payer: BC Managed Care – PPO | Admitting: Family Medicine

## 2021-05-08 VITALS — BP 116/70 | HR 74 | Ht 65.0 in | Wt 206.0 lb

## 2021-05-08 DIAGNOSIS — F419 Anxiety disorder, unspecified: Secondary | ICD-10-CM

## 2021-05-08 DIAGNOSIS — F640 Transsexualism: Secondary | ICD-10-CM

## 2021-05-08 DIAGNOSIS — R109 Unspecified abdominal pain: Secondary | ICD-10-CM | POA: Diagnosis not present

## 2021-05-08 DIAGNOSIS — Z91018 Allergy to other foods: Secondary | ICD-10-CM

## 2021-05-08 DIAGNOSIS — J4520 Mild intermittent asthma, uncomplicated: Secondary | ICD-10-CM

## 2021-05-08 DIAGNOSIS — Z7689 Persons encountering health services in other specified circumstances: Secondary | ICD-10-CM

## 2021-05-08 DIAGNOSIS — F3341 Major depressive disorder, recurrent, in partial remission: Secondary | ICD-10-CM

## 2021-05-08 DIAGNOSIS — Z8711 Personal history of peptic ulcer disease: Secondary | ICD-10-CM

## 2021-05-08 DIAGNOSIS — G8929 Other chronic pain: Secondary | ICD-10-CM

## 2021-05-08 MED ORDER — ALBUTEROL SULFATE HFA 108 (90 BASE) MCG/ACT IN AERS: 2.0000 | INHALATION_SPRAY | Freq: Four times a day (QID) | RESPIRATORY_TRACT | 1 refills | Status: AC | PRN

## 2021-05-08 MED ORDER — DICYCLOMINE HCL 10 MG PO CAPS: 10.0000 mg | ORAL_CAPSULE | Freq: Three times a day (TID) | ORAL | 2 refills | Status: DC

## 2021-05-08 MED ORDER — AUVI-Q 0.3 MG/0.3ML IJ SOAJ: 0.3000 mg | INTRAMUSCULAR | 1 refills | Status: AC | PRN

## 2021-05-08 NOTE — Patient Instructions (Addendum)
Thank you for coming to the office today.  Try the Dicyclomine as needed for abdominal pain / cramping symptoms etc with meals.  Refills sent AuviQ and Albuterol  Call Dr Ardis Hughs office [facility] - to follow up sooner on the chronic abdominal pain  DUE for FASTING BLOOD WORK (no food or drink after midnight before the lab appointment, only water or coffee without cream/sugar on the morning of)  SCHEDULE "Lab Only" visit in the morning at the clinic for lab draw in 3 MONTHS   - Make sure Lab Only appointment is at about 1 week before your next appointment, so that results will be available  For Lab Results, once available within 2-3 days of blood draw, you can can log in to MyChart online to view your results and a brief explanation. Also, we can discuss results at next follow-up visit.    Please schedule a Follow-up Appointment to: Return in about 3 months (around 08/08/2021) for 3 month follow-up Chronic Abd Pain / Possible Labs in AM.  If you have any other questions or concerns, please feel free to call the office or send a message through [facility]. You may also schedule an earlier appointment if necessary.  Additionally, you may be receiving a survey about your experience at our office within a few days to 1 week by e-mail or mail. We value your feedback.  Nobie Putnam, DO [facility]

## 2021-05-08 NOTE — Progress Notes (Signed)
Subjective:    Patient ID: Wanda Terry, adult    DOB: 04-17-2001, 20 y.o.   MRN: [id number]  Wanda Terry is a 20 y.o. 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 He has difficulty with stomach emptying and ready to eat something else.  Improved symptoms on IBGard Peppermint Oil  Describes lower abdominal bloating pain, more dull symptoms. Can have occasional sharp pains.  [facility] Previously has seen Dr [name] 10/2020 Their office may have GI records He went to them previously and ultimately developed ovarian cyst that was the problem, later saw GYN  History of GI PUD in 2017 - EGD x 2 in history, to diagnose and repeat to follow-up. He had gastric emptying study, in 2017 and showed significant gastroparesis.  History of Anaphylaxis Food allergy Peanuts and Treenuts, has had prior reaction in 2017. Now currently doing well. He has Auvi-Q EpiPen needs refill x 2.   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 Past med failures - Prozac, Wellbutrin, Abilify.  Living as female since 2018, he lacked parental support. Grandparents live in [place], [place] and they are good support system as well as friends.  [facility] [place] [facility] 1 month ago.    Depression screen [facility] 2/9 05/08/2021 11/24/2020  Decreased  Interest 1 1  Down, Depressed, Hopeless 1 1  PHQ - 2 Score 2 2  Altered sleeping 1 2  Tired, decreased energy 1 3  Change in appetite 1 3  Feeling bad or failure about yourself  1 1  Trouble concentrating 2 2  Moving slowly or fidgety/restless 0 2  Suicidal thoughts 0 0  PHQ-9 Score 8 15  Difficult doing work/chores Somewhat difficult Somewhat difficult    Past Medical History:  Diagnosis Date   Allergy    Anxiety    Asthma    Constipation by delayed colonic transit    Depression    Gastroparesis    Past Surgical History:  Procedure Laterality Date   NASAL ENDOSCOPY     ROBOTIC ASSISTED LAPAROSCOPIC OVARIAN CYSTECTOMY Right 03/15/2021   Procedure: XI ROBOTIC ASSISTED LAPAROSCOPIC OVARIAN CYSTECTOMY;  Surgeon: Homero Fellers, MD;  Location: [facility] ORS;  Service: Gynecology;  Laterality: Right;   WISDOM TOOTH EXTRACTION     Social History   Socioeconomic History   Marital status: Single    Spouse name: Not on file   Number of children: Not on file   Years of education: Not on file   Highest education level: Not on file  Occupational History   Occupation: college student    Comment: [employer]  Tobacco Use   Smoking status: Never   Smokeless tobacco: Never  Vaping Use   Vaping Use: Never used  Substance and Sexual Activity   Alcohol use: Not Currently   Drug use: Never   Sexual activity: Yes    Birth control/protection: None  Other Topics Concern  Not on file  Social History Narrative   Patient stays with grandmother this summer [place] she goes to [place].   Mother is coming from [place] to stay with patient for a week after surgery   Social Determinants of Health   Financial Resource Strain: Not on file  Food Insecurity: Not on file  Transportation Needs: Not on file  Physical Activity: Not on file  Stress: Not on file  Social Connections: Not on file  Intimate Partner Violence: Not on file   Family History  Problem Relation Age of Onset   Diabetes Maternal  Grandfather    Heart disease Maternal Grandfather    Current Outpatient Medications on File Prior to Visit  Medication Sig   lamoTRIgine (LAMICTAL) 150 MG tablet Take 300 mg by mouth daily.   loratadine (CLARITIN) 10 MG tablet Take 10 mg by mouth daily.   Melatonin 5 MG CAPS Take 5 mg by mouth at bedtime as needed (sleep).   Peppermint Oil (IBGARD PO) Take 2-3 tablets by mouth daily as needed (stomach pain).   sertraline (ZOLOFT) 100 MG tablet Take 200 mg by mouth daily.   TESTOSTERONE IM Inject 0.25 mLs into the muscle once a week.   ibuprofen (ADVIL) 200 MG tablet Take 200 mg by mouth every 6 (six) hours as needed for headache or mild pain.   No current facility-administered medications on file prior to visit.    Review of Systems Per HPI unless specifically indicated above     Objective:    BP 116/70   Pulse 74   Ht 5\' 5"  (1.651 m)   Wt 206 lb (93.4 kg)   SpO2 99%   BMI 34.28 kg/m   Wt Readings from Last 3 Encounters:  05/08/21 206 lb (93.4 kg) (98 %, Z= 2.03)*  04/09/21 204 lb 6.4 oz (92.7 kg) (98 %, Z= 2.01)*  03/23/21 199 lb 9.6 oz (90.5 kg) (97 %, Z= 1.94)*   * Growth percentiles are based on CDC (Girls, 2-20 Years) data.    Physical Exam Vitals and nursing note reviewed.  Constitutional:      General: He is not in acute distress.    Appearance: Normal appearance. He is well-developed. He is not diaphoretic.     Comments: Well-appearing, comfortable, cooperative  HENT:     Head: Normocephalic and atraumatic.  Eyes:     General:        Right eye: No discharge.        Left eye: No discharge.     Conjunctiva/sclera: Conjunctivae normal.  Cardiovascular:     Rate and Rhythm: Normal rate.  Pulmonary:     Effort: Pulmonary effort is normal.  Skin:    General: Skin is warm and dry.     Findings: No erythema or rash.  Neurological:     Mental Status: He is alert and oriented to person, place, and time.  Psychiatric:        Mood and Affect: Mood normal.         Behavior: Behavior normal.        Thought Content: Thought content normal.     Comments: Well groomed, good eye contact, normal speech and thoughts   Results for orders placed or performed during the hospital encounter of 03/15/21  Pregnancy, urine POC  Result Value Ref Range   Preg Test, Ur NEGATIVE NEGATIVE  ABO/Rh  Result Value Ref Range   ABO/RH(D)      A POS Performed at [facility], [address], [place], [place] [address]   Surgical pathology  Result Value Ref Range   SURGICAL PATHOLOGY      SURGICAL PATHOLOGY CASE: [id number] PATIENT: Wanda Terry Surgical Pathology Report     Specimen Submitted: A. Ovarian cyst, right  Clinical History: Right ovarian cyst      DIAGNOSIS: A. 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   GROSS DESCRIPTION: A. Labeled: Right ovarian cyst Received: Formalin Collection time: 4:08 PM on 03/15/2021 Placed into formalin time: 4:12 PM on 03/15/2021 Type of procedure: Laparoscopic ovarian cystectomy Integrity: Focally disrupted Weight of specimen: 5.47 grams Size of specimen:           Ovary: 2.9 x 2.3 x 1.7 cm           Fallopian tube: None grossly present Ovarian external surface: The external surface is tan-white and finely lobulated with a focal area of hair.  There are 2 defects, 1.5 x 0.9 cm and 0.8 x 0.6 cm. Ovarian internal surface: The cut surface shows the specimen is comprised of a unilocular cyst which contains yellow lobulated adipose tissue .  There is a 0.7 x 0.6 x 0.6 cm thickened area which contains calcified material.  No distinct ovarian parenchyma is identified.  Block summary: 1 - calcified area, submitted entirely, following decalcification 2 - representative cyst with hair bearing area and central adipose tissue  RB 03/16/2021  Final Diagnosis performed by Betsy Pries, MD.   Electronically signed 03/20/2021 3:23:39PM The  electronic signature indicates that the named Attending Pathologist has evaluated the specimen Technical component performed at [facility], [address] [place], [place] [address] Lab: [phone] Dir: Rush Farmer, MD, MMM  Professional component performed at [facility], [facility], [place], [place], [place] [address] Lab: [phone] Dir: Dellia Nims. 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   Chronic abdominal pain - Primary   Relevant Medications   dicyclomine (BENTYL) 10 MG capsule   Other Visit Diagnoses     Encounter to establish care with new doctor           Request outside prior pediatric record from [facility]  Has records at Psychiatry currently followed in [facility] remotely  Chronic Abdominal Pain Reviewed prior records Has established with [facility] Dr Ardis Hughs Will return now for further management of chronic functional GI symptoms based on history Has had prior EGD, currently not having symptoms of PUD Continue Peppermint oil and dietary management Will start trial on Dicyclomine PRN for functional symptoms  Mental Health  Depression, recurrent partial remission Anxiety Gender Dysphoria Transgender Female to Female, on hormone therapy  Followed by Psych / therapist in [facility] remotely On medication management, see HPI Continues on current therapy [facility]  History of disordered eating / restrictive, resolved , followed by psychiatry  History Asthma, without flare, mild intermittent Hx anaphylaxis food allergy Refill Epi Pen and Albuterol   Meds ordered this  encounter  Medications   dicyclomine (BENTYL) 10 MG capsule    Sig: Take 1 capsule (10 mg total) by  mouth 4 (four) times daily -  before meals and at bedtime.    Dispense:  30 capsule    Refill:  2   AUVI-Q 0.3 MG/0.3ML SOAJ injection    Sig: Inject 0.3 mg into the muscle as needed for anaphylaxis.    Dispense:  2 each    Refill:  1   albuterol (VENTOLIN HFA) 108 (90 Base) MCG/ACT inhaler    Sig: Inhale 2 puffs into the lungs every 6 (six) hours as needed for wheezing or shortness of breath.    Dispense:  6.7 g    Refill:  1      Follow up plan: Return in about 3 months (around 08/08/2021) for 3 month follow-up Chronic Abd Pain / Possible Labs in [place], DO [facility] 05/08/2021, 3:49 PM

## 2021-07-10 ENCOUNTER — Encounter: Payer: Self-pay | Admitting: Gastroenterology

## 2021-07-10 ENCOUNTER — Ambulatory Visit (INDEPENDENT_AMBULATORY_CARE_PROVIDER_SITE_OTHER): Payer: BC Managed Care – PPO | Admitting: Gastroenterology

## 2021-07-10 VITALS — BP 98/72 | HR 83 | Ht 65.0 in | Wt 207.1 lb

## 2021-07-10 DIAGNOSIS — R140 Abdominal distension (gaseous): Secondary | ICD-10-CM

## 2021-07-10 NOTE — Progress Notes (Signed)
HPI: This is a very pleasant biologic female who prefers to be he him his  I saw Wanda Terry last about 8 months ago here in the office.  At that time he mentioned previous history of ulcers in his stomach.  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  I tried to load his CT scan images however my computer was not able to locate those.  I reviewed a packet of information from his [facility] team and do not see any imaging results.  I was able to get a copy of his CT scan report which showed "right adnexa 2.0 x 1.6 cm dermoid" and so I referred to a gynecologist.   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  He feels better in his lower abdomen since then.  His biggest issue is that he has significant bloating.  Feels bloated most of the time.  He also is very bothered by constipation.  He can go 5 to 6 days without a single bowel movement and then he will either have to really push or strain to get a very hard bowel movement out or he will have significant loose stools for a day or 2.  Then the cycle starts again with 5 or 6 days more of constipation.  Sometimes he has taken "Dulcolax this will be about 2-3 times per month and it does seem to help.  He never sees blood in his stool.  He does not have a family history of colon cancer.  He has gained  7 pounds in the past 6 months or so.   ROS: complete GI ROS as described in HPI, all other review negative.  Constitutional:  No unintentional weight loss   Past Medical History:  Diagnosis Date   Allergy    Anxiety    Asthma    Constipation by delayed colonic transit    Depression    Gastroparesis     Past Surgical History:  Procedure Laterality Date   NASAL ENDOSCOPY     ROBOTIC ASSISTED LAPAROSCOPIC OVARIAN CYSTECTOMY Right 03/15/2021   Procedure: XI ROBOTIC ASSISTED LAPAROSCOPIC OVARIAN CYSTECTOMY;  Surgeon: Homero Fellers, MD;  Location: [facility] ORS;  Service: Gynecology;  Laterality: Right;   WISDOM TOOTH EXTRACTION      Current Outpatient Medications  Medication Sig Dispense Refill   albuterol (VENTOLIN HFA) 108 (90 Base) MCG/ACT inhaler Inhale 2 puffs into the lungs every 6 (six) hours as needed for wheezing or shortness of breath. 6.7 g 1   AUVI-Q 0.3 MG/0.3ML SOAJ injection Inject 0.3 mg into the muscle as needed for anaphylaxis. 2 each 1   dicyclomine (BENTYL) 10 MG capsule Take 1 capsule (10 mg total) by mouth 4 (four) times daily -  before meals and at bedtime. 30 capsule 2   lamoTRIgine (LAMICTAL) 150 MG tablet Take 300 mg by mouth  daily.     loratadine (CLARITIN) 10 MG tablet Take 10 mg by mouth daily.     Melatonin 5 MG CAPS Take 5 mg by mouth at bedtime as needed (sleep).     Peppermint Oil (IBGARD PO) Take 2-3 tablets by mouth daily as needed (stomach pain).     sertraline (ZOLOFT) 100 MG tablet Take 200 mg by mouth daily.     TESTOSTERONE IM Inject 0.375 mLs into the muscle once a week.     No current facility-administered medications for this visit.    Allergies as of 07/10/2021 - Review Complete 07/10/2021  Allergen Reaction Noted   Amoxicillin Anaphylaxis 10/23/2020   Other Anaphylaxis 10/24/2020    Family History  Problem Relation Age of Onset   Diabetes Maternal Grandfather    Heart disease Maternal Grandfather     Social History    Socioeconomic History   Marital status: Single    Spouse name: Not on file   Number of children: Not on file   Years of education: Not on file   Highest education level: Not on file  Occupational History   Occupation: college student    Comment: [employer]  Tobacco Use   Smoking status: Never   Smokeless tobacco: Never  Vaping Use   Vaping Use: Never used  Substance and Sexual Activity   Alcohol use: Not Currently   Drug use: Never   Sexual activity: Yes    Birth control/protection: None  Other Topics Concern   Not on file  Social History Narrative   Patient stays with grandmother this summer as she goes to [place].   Mother is coming from [place] to stay with patient for a week after surgery   Social Determinants of Health   Financial Resource Strain: Not on file  Food Insecurity: Not on file  Transportation Needs: Not on file  Physical Activity: Not on file  Stress: Not on file  Social Connections: Not on file  Intimate Partner Violence: Not on file     Physical Exam: BP 98/72 (BP Location: Left Arm, Patient Position: Sitting, Cuff Size: Normal)   Pulse 83   Ht 5\' 5"  (1.651 m)   Wt 207 lb 2 oz (94 kg)   SpO2 98%   BMI 34.47 kg/m  Constitutional: generally well-appearing Psychiatric: alert and oriented x3 Abdomen: soft, nontender, nondistended, no obvious ascites, no peritoneal signs, normal bowel sounds No peripheral edema noted in lower extremities  Assessment and plan: 20 y.o. adult with with significant bloating and constipation predominant alternating bowel habits  I explained that getting him to move his bowels are more regular basis every day or every other day will probably help his bloating.  To that end I recommend a trial of fiber supplements Citrucel in large glass of water.  He will return to see me in 5 or 6 weeks and sooner if needed.  He understands that if this is not helpful he will probably need further testing to make sure we are not missing  anything else.  Please see the "Patient Instructions" section for addition details about the plan.  [name], MD [facility] 07/10/2021, 2:30 PM   Total time on date of encounter was 30 minutes (this included time spent preparing to see the patient reviewing records; obtaining and/or reviewing separately obtained history; performing a medically appropriate exam and/or evaluation; counseling and educating the patient and family if present; ordering medications, tests or procedures if applicable; and documenting clinical information in the health record).

## 2021-07-10 NOTE — Patient Instructions (Signed)
Please start taking citrucel (orange flavored) powder fiber supplement.  This may cause some bloating at first but that usually goes away. Begin with a small spoonful and work your way up to a large, heaping spoonful daily over a week.  Follow up with Dr. Ardis Hughs in 6 weeks. Your appointment is scheduled for 08/27/21 at 10:30am.

## 2021-08-14 ENCOUNTER — Ambulatory Visit: Payer: BC Managed Care – PPO | Admitting: Family Medicine

## 2021-08-27 ENCOUNTER — Ambulatory Visit (INDEPENDENT_AMBULATORY_CARE_PROVIDER_SITE_OTHER): Payer: BC Managed Care – PPO | Admitting: Gastroenterology

## 2021-08-27 ENCOUNTER — Encounter: Payer: Self-pay | Admitting: Gastroenterology

## 2021-08-27 VITALS — BP 100/60 | HR 88 | Ht 65.0 in | Wt 211.0 lb

## 2021-08-27 DIAGNOSIS — R140 Abdominal distension (gaseous): Secondary | ICD-10-CM

## 2021-08-27 DIAGNOSIS — Z8639 Personal history of other endocrine, nutritional and metabolic disease: Secondary | ICD-10-CM

## 2021-08-27 NOTE — Patient Instructions (Signed)
If you are age 20 or younger, your body mass index should be between 19-25. Your Body mass index is 35.11 kg/m. If this is out of the aformentioned range listed, please consider follow up with your Primary Care Provider.   ________________________________________________________  The [facility] providers would like to encourage you to use [facility] to communicate with providers for non-urgent requests or questions.  Due to long hold times on the telephone, sending your provider a message by [facility] may be a faster and more efficient way to get a response.  Please allow 48 business hours for a response.  Please remember that this is for non-urgent requests.  _______________________________________________________  Wanda Terry have been scheduled for a gastric emptying scan at [facility] on 09-05-21 after your abdominal ultrasound. Please make certain not to have anything to eat or drink after midnight the night before your test. Hold all stomach medications (ex: Zofran, phenergan, Reglan) 48 hours prior to your test. If you need to reschedule your appointment, please contact radiology scheduling at [phone]. _____________________________________________________________________ A gastric-emptying study measures how long it takes for food to move through your stomach. There are several ways to measure stomach emptying. In the most common test, you eat food that contains a small amount of radioactive material. A scanner that detects the movement of the radioactive material is placed over your abdomen to monitor the rate at which food leaves your stomach. This test normally takes about 4 hours to complete. _____________________________________________________________________  Wanda Terry have been scheduled for an abdominal ultrasound at [facility] (1st floor of hospital) on 09-05-21 at 8:30am. Please arrive 15 minutes prior to your appointment for registration.  Should you need to reschedule your  appointment, please contact radiology at [phone]. This test typically takes about 30 minutes to perform.  Due to recent changes in healthcare laws, you may see the results of your imaging and laboratory studies on MyChart before your provider has had a chance to review them.  We understand that in some cases there may be results that are confusing or concerning to you. Not all laboratory results come back in the same time frame and the provider may be waiting for multiple results in order to interpret others.  Please give [place] 48 hours in order for your provider to thoroughly review all the results before contacting the office for clarification of your results.   Thank you for entrusting me with your care and choosing [facility].  Dr Ardis Hughs

## 2021-08-27 NOTE — Progress Notes (Signed)
Review of pertinent gastrointestinal problems: 1.  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  Constipation, bloating.  Presumed functional.  Trial of fiber supplement daily September 2022   HPI: This is a very pleasant 20 year old female who identifies as a man whom I last saw about 2 months ago  I last saw Wanda Terry about 2 months ago.  I recommended that he start taking fiber supplements on a daily basis to help his chronic constipation and bloating.  He has gained about 3 pounds since his last office visit here.  Same scale.  Citrucel has clearly helped his constipation.  He is moving his bowels generally every other day whereas prior to the Citrucel he would have to struggle to even move it once a week.  He is still however bothered by postprandial periumbilical discomforts that are sometimes bloating, sometimes and epigastric discomfort.  These seem to worsen throughout the day.  He tells me in 2017 he underwent a lot of testing including EGD and gastric emptying scan and he was found to have gastroparesis.  ROS: complete GI ROS as described in HPI, all other review negative.  Constitutional:  No unintentional weight loss   Past Medical History:  Diagnosis Date   Allergy    Anxiety    Asthma    Constipation by delayed colonic transit    Depression    Gastroparesis     Past Surgical History:  Procedure Laterality Date   NASAL ENDOSCOPY     ROBOTIC ASSISTED LAPAROSCOPIC OVARIAN CYSTECTOMY Right 03/15/2021   Procedure: XI ROBOTIC ASSISTED LAPAROSCOPIC OVARIAN CYSTECTOMY;  Surgeon: Homero Fellers, MD;  Location: [facility] ORS;  Service: Gynecology;  Laterality: Right;   WISDOM TOOTH EXTRACTION      Current Outpatient Medications  Medication Sig Dispense Refill   albuterol (VENTOLIN HFA) 108 (90 Base) MCG/ACT inhaler Inhale 2 puffs into the lungs every 6 (six) hours  as needed for wheezing or shortness of breath. 6.7 g 1   AUVI-Q 0.3 MG/0.3ML SOAJ injection Inject 0.3 mg into the muscle as needed for anaphylaxis. 2 each 1   lamoTRIgine (LAMICTAL) 150 MG tablet Take 300 mg by mouth daily.     loratadine (CLARITIN) 10 MG tablet Take 10 mg by mouth daily.     Melatonin 5 MG CAPS Take 5 mg by mouth at bedtime as needed (sleep).     sertraline (ZOLOFT) 100 MG tablet Take 200 mg by mouth daily.     TESTOSTERONE IM Inject 0.375 mLs into the muscle once a week.     No current facility-administered medications for this visit.    Allergies as of 08/27/2021 - Review Complete 08/27/2021  Allergen Reaction Noted   Amoxicillin Anaphylaxis 10/23/2020   Other Anaphylaxis 10/24/2020    Family History  Problem Relation Age of Onset   Diabetes Maternal Grandfather    Heart disease Maternal Grandfather     Social History   Socioeconomic History   Marital status: Single    Spouse name: Not on file   Number of children: Not on file   Years of education: Not on file   Highest education level: Not on file  Occupational History   Occupation: college student    Comment: [employer]  Tobacco Use   Smoking status: Never   Smokeless tobacco: Never  Vaping Use   Vaping Use: Never used  Substance and Sexual Activity   Alcohol use: Not  Currently   Drug use: Never   Sexual activity: Yes    Birth control/protection: None  Other Topics Concern   Not on file  Social History Narrative   Patient stays with grandmother this summer as she goes to [place].   Mother is coming from [place] to stay with patient for a week after surgery   Social Determinants of Health   Financial Resource Strain: Not on file  Food Insecurity: Not on file  Transportation Needs: Not on file  Physical Activity: Not on file  Stress: Not on file  Social Connections: Not on file  Intimate Partner Violence: Not on file     Physical Exam: Ht 5\' 5"  (1.651 m)   Wt 211 lb (95.7 kg)   BMI 35.11  kg/m  Constitutional: generally well-appearing Psychiatric: alert and oriented x3 Abdomen: soft, nontender, nondistended, no obvious ascites, no peritoneal signs, normal bowel sounds No peripheral edema noted in lower extremities  Assessment and plan: 20 y.o. adult with improved constipation, persistent postprandial abdominal discomforts  Certainly glad that his constipation has improved.  I was hoping that as his bowels move more regularly then his postprandial abdominal discomfort would improve as well but that is not the case.  I recommended further testing with abdominal ultrasound with particular attention to the gallbladder checking for gallstones as well as gastric emptying scan since he was told that he had gastroparesis in the past and certainly has postprandial discomforts would be consistent with gastroparesis.  Please see the "Patient Instructions" section for addition details about the plan.  [name], MD [facility] 08/27/2021, 10:35 AM   Total time on date of encounter was 30 minutes (this included time spent preparing to see the patient reviewing records; obtaining and/or reviewing separately obtained history; performing a medically appropriate exam and/or evaluation; counseling and educating the patient and family if present; ordering medications, tests or procedures if applicable; and documenting clinical information in the health record).

## 2021-09-05 ENCOUNTER — Encounter (HOSPITAL_COMMUNITY)
Admission: RE | Admit: 2021-09-05 | Discharge: 2021-09-05 | Disposition: A | Discharge status: Home / Self Care | Payer: BC Managed Care – PPO | Source: Ambulatory Visit | Attending: Gastroenterology | Admitting: Gastroenterology

## 2021-09-05 ENCOUNTER — Ambulatory Visit (HOSPITAL_COMMUNITY)
Admission: RE | Admit: 2021-09-05 | Discharge: 2021-09-05 | Disposition: A | Discharge status: Home / Self Care | Payer: BC Managed Care – PPO | Source: Ambulatory Visit | Attending: Gastroenterology | Admitting: Gastroenterology

## 2021-09-05 DIAGNOSIS — Z8639 Personal history of other endocrine, nutritional and metabolic disease: Secondary | ICD-10-CM | POA: Diagnosis present

## 2021-09-05 DIAGNOSIS — R140 Abdominal distension (gaseous): Secondary | ICD-10-CM | POA: Insufficient documentation

## 2021-09-05 MED ORDER — TECHNETIUM TC 99M SULFUR COLLOID
2.2000 | Freq: Once | INTRAVENOUS | Status: AC
  Administered 2021-09-05: 2.2 via ORAL

## 2021-09-21 ENCOUNTER — Encounter: Payer: Self-pay | Admitting: Gastroenterology

## 2022-02-11 ENCOUNTER — Encounter: Payer: Self-pay | Admitting: Gastroenterology

## 2022-02-12 ENCOUNTER — Encounter: Payer: Self-pay | Admitting: Gastroenterology

## 2022-02-12 ENCOUNTER — Telehealth: Payer: Self-pay

## 2022-02-12 NOTE — Telephone Encounter (Signed)
Patient has been scheduled for an EGD with Dr. Ardis Hughs 03/13/22.  ?

## 2022-02-12 NOTE — Telephone Encounter (Signed)
Left message for patient to call back to schedule EGD with Dr. Ardis Hughs. ?

## 2022-02-19 ENCOUNTER — Ambulatory Visit (AMBULATORY_SURGERY_CENTER): Payer: BC Managed Care – PPO | Admitting: *Deleted

## 2022-02-19 VITALS — Ht 65.0 in | Wt 211.0 lb

## 2022-02-19 DIAGNOSIS — R140 Abdominal distension (gaseous): Secondary | ICD-10-CM

## 2022-02-19 DIAGNOSIS — G8929 Other chronic pain: Secondary | ICD-10-CM

## 2022-02-19 DIAGNOSIS — R1031 Right lower quadrant pain: Secondary | ICD-10-CM

## 2022-02-19 NOTE — Progress Notes (Signed)
No egg or soy allergy known to patient  ?No issues known to pt with past sedation with any surgeries or procedures ?Patient denies ever being told they had issues or difficulty with intubation  ?No FH of Malignant Hyperthermia ?Pt is not on diet pills ?Pt is not on  home 02  ?Pt is not on blood thinners  ? ?No A fib or A flutter ? ? ?PV completed over the phone. Pt verified name, DOB, address and insurance during PV today.  ?Pt mailed instruction packet with copy of consent form to read and not return, and instructions.  ?Pt encouraged to call with questions or issues.  ?If pt has My chart, procedure instructions sent via My Chart  ?Insurance confirmed with pt at [facility] today   ?

## 2022-03-13 ENCOUNTER — Encounter: Payer: BC Managed Care – PPO | Admitting: Gastroenterology

## 2022-03-18 ENCOUNTER — Encounter: Payer: Self-pay | Admitting: Certified Registered Nurse Anesthetist

## 2022-03-20 ENCOUNTER — Encounter: Payer: Self-pay | Admitting: Gastroenterology

## 2022-03-22 ENCOUNTER — Encounter: Payer: Self-pay | Admitting: Gastroenterology

## 2022-03-22 ENCOUNTER — Ambulatory Visit (AMBULATORY_SURGERY_CENTER): Payer: BC Managed Care – PPO | Admitting: Gastroenterology

## 2022-03-22 VITALS — BP 108/51 | HR 62 | Temp 98.6°F | Resp 11 | Ht 65.0 in | Wt 211.0 lb

## 2022-03-22 DIAGNOSIS — K2951 Unspecified chronic gastritis with bleeding: Secondary | ICD-10-CM | POA: Diagnosis not present

## 2022-03-22 DIAGNOSIS — G8929 Other chronic pain: Secondary | ICD-10-CM

## 2022-03-22 DIAGNOSIS — R140 Abdominal distension (gaseous): Secondary | ICD-10-CM

## 2022-03-22 DIAGNOSIS — K2970 Gastritis, unspecified, without bleeding: Secondary | ICD-10-CM | POA: Diagnosis not present

## 2022-03-22 DIAGNOSIS — K30 Functional dyspepsia: Secondary | ICD-10-CM

## 2022-03-22 MED ORDER — SODIUM CHLORIDE 0.9 % IV SOLN: 500.0000 mL | Freq: Once | INTRAVENOUS | Status: AC

## 2022-03-22 NOTE — Progress Notes (Signed)
Called to room to assist during endoscopic procedure.  Patient ID and intended procedure confirmed with present staff. Received instructions for my participation in the procedure from the performing physician.  

## 2022-03-22 NOTE — Progress Notes (Signed)
HPI: This is a man with dyspepsia, abdopminal discomforts   ROS: complete GI ROS as described in HPI, all other review negative.  Constitutional:  No unintentional weight loss   Past Medical History:  Diagnosis Date   Allergy    Anxiety    Asthma    Constipation by delayed colonic transit    Depression    Gastroparesis    GERD (gastroesophageal reflux disease)     Past Surgical History:  Procedure Laterality Date   NASAL ENDOSCOPY     ROBOTIC ASSISTED LAPAROSCOPIC OVARIAN CYSTECTOMY Right 03/15/2021   Procedure: XI ROBOTIC ASSISTED LAPAROSCOPIC OVARIAN CYSTECTOMY;  Surgeon: Homero Fellers, MD;  Location: [facility] ORS;  Service: Gynecology;  Laterality: Right;   WISDOM TOOTH EXTRACTION      Current Outpatient Medications  Medication Sig Dispense Refill   lamoTRIgine (LAMICTAL) 150 MG tablet Take 300 mg by mouth daily.     loratadine (CLARITIN) 10 MG tablet Take 10 mg by mouth daily.     Melatonin 5 MG CAPS Take 5 mg by mouth at bedtime as needed (sleep).     propranolol (INDERAL) 10 MG tablet Take 10 mg by mouth daily.     sertraline (ZOLOFT) 100 MG tablet Take 200 mg by mouth daily.     Simethicone 125 MG CAPS      testosterone cypionate (DEPOTESTOSTERONE CYPIONATE) 200 MG/ML injection SMARTSIG:0.375 Milliliter(s) SUB-Q Once a Week     albuterol (VENTOLIN HFA) 108 (90 Base) MCG/ACT inhaler Inhale 2 puffs into the lungs every 6 (six) hours as needed for wheezing or shortness of breath. 6.7 g 1   AUVI-Q 0.3 MG/0.3ML SOAJ injection Inject 0.3 mg into the muscle as needed for anaphylaxis. 2 each 1   TESTOSTERONE IM Inject 0.375 mLs into the muscle once a week.     Current Facility-Administered Medications  Medication Dose Route Frequency Provider Last Rate Last Admin   0.9 %  sodium chloride infusion  500 mL Intravenous Once Milus Banister, MD        Allergies as of 03/22/2022 - Review Complete 03/22/2022  Allergen Reaction Noted   Amoxicillin Anaphylaxis 10/23/2020    Other Anaphylaxis 10/24/2020    Family History  Problem Relation Age of Onset   Diabetes Maternal Grandfather    Heart disease Maternal Grandfather    Colon cancer Neg Hx    Colon polyps Neg Hx    Stomach cancer Neg Hx    Rectal cancer Neg Hx    Esophageal cancer Neg Hx     Social History   Socioeconomic History   Marital status: Single    Spouse name: Not on file   Number of children: Not on file   Years of education: Not on file   Highest education level: Not on file  Occupational History   Occupation: college student    Comment: [employer]  Tobacco Use   Smoking status: Never   Smokeless tobacco: Never  Vaping Use   Vaping Use: Never used  Substance and Sexual Activity   Alcohol use: Not Currently   Drug use: Never   Sexual activity: Yes    Birth control/protection: None  Other Topics Concern   Not on file  Social History Narrative   Patient stays with grandmother this summer as she goes to [place].   Mother is coming from [place] to stay with patient for a week after surgery   Social Determinants of Health   Financial Resource Strain: Not on file  Food Insecurity: Not  on file  Transportation Needs: Not on file  Physical Activity: Not on file  Stress: Not on file  Social Connections: Not on file  Intimate Partner Violence: Not on file     Physical Exam: BP (!) 104/44   Pulse 71   Temp 98.6 F (37 C)   Ht '5\' 5"'$  (1.651 m)   Wt 211 lb (95.7 kg)   SpO2 96%   BMI 35.11 kg/m  Constitutional: generally well-appearing Psychiatric: alert and oriented x3 Lungs: CTA bilaterally Heart: no MCR  Assessment and plan: 21 y.o. adult with dyspepsia, abd discomforts  EGD today  Care is appropriate for the ambulatory setting.  [name], MD [facility] 03/22/2022, 9:31 AM

## 2022-03-22 NOTE — Patient Instructions (Signed)
Read all of the handouts given to you by your recovery room nurse.  YOU HAD AN ENDOSCOPIC PROCEDURE TODAY AT [facility]:   Refer to the procedure report that was given to you for any specific questions about what was found during the examination.  If the procedure report does not answer your questions, please call your gastroenterologist to clarify.  If you requested that your care partner not be given the details of your procedure findings, then the procedure report has been included in a sealed envelope for you to review at your convenience later.  YOU SHOULD EXPECT: Some feelings of bloating in the abdomen. Passage of more gas than usual.  Walking can help get rid of the air that was put into your GI tract during the procedure and reduce the bloating.   Please Note:  You might notice some irritation and congestion in your nose or some drainage.  This is from the oxygen used during your procedure.  There is no need for concern and it should clear up in a day or so.  SYMPTOMS TO REPORT IMMEDIATELY:   Following upper endoscopy (EGD)  Vomiting of blood or coffee ground material  New chest pain or pain under the shoulder blades  Painful or persistently difficult swallowing  New shortness of breath  Fever of 100F or higher  Black, tarry-looking stools  For urgent or emergent issues, a gastroenterologist can be reached at any hour by calling [phone]. Do not use MyChart messaging for urgent concerns.    DIET:  We do recommend a small meal at first, but then you may proceed to your regular diet.  Drink plenty of fluids but you should avoid alcoholic beverages for 24 hours.  ACTIVITY:  You should plan to take it easy for the rest of today and you should NOT DRIVE or use heavy machinery until tomorrow (because of the sedation medicines used during the test).    FOLLOW UP: Our staff will call the number listed on your records 48-72 hours following your procedure to check  on you and address any questions or concerns that you may have regarding the information given to you following your procedure. If we do not reach you, we will leave a message.  We will attempt to reach you two times.  During this call, we will ask if you have developed any symptoms of COVID 19. If you develop any symptoms (ie: fever, flu-like symptoms, shortness of breath, cough etc.) before then, please call [phone].  If you test positive for Covid 19 in the 2 weeks post procedure, please call and report this information to [place].    If any biopsies were taken you will be contacted by phone or by letter within the next 1-3 weeks.  Please call us at [phone] if you have not heard about the biopsies in 3 weeks.    SIGNATURES/CONFIDENTIALITY: You and/or your care partner have signed paperwork which will be entered into your electronic medical record.  These signatures attest to the fact that that the information above on your After Visit Summary has been reviewed and is understood.  Full responsibility of the confidentiality of this discharge information lies with you and/or your care-partner.

## 2022-03-22 NOTE — Progress Notes (Signed)
Pt's states no medical or surgical changes since previsit or office visit. 

## 2022-03-22 NOTE — Op Note (Signed)
[place] Patient Name: Wanda Terry Procedure Date: 03/22/2022 9:29 AM MRN: [id number] Endoscopist: Milus Banister , MD Age: 21 Referring MD:  Date of Birth: 11-18-2000 Gender: Female Account #: [id number] Procedure:                Upper GI endoscopy Indications:              Dyspepsia Medicines:                Monitored Anesthesia Care Procedure:                Pre-Anesthesia Assessment:                           - Prior to the procedure, a History and Physical                            was performed, and patient medications and                            allergies were reviewed. The patient's tolerance of                            previous anesthesia was also reviewed. The risks                            and benefits of the procedure and the sedation                            options and risks were discussed with the patient.                            All questions were answered, and informed consent                            was obtained. Prior Anticoagulants: The patient has                            taken no previous anticoagulant or antiplatelet                            agents. ASA Grade Assessment: II - A patient with                            mild systemic disease. After reviewing the risks                            and benefits, the patient was deemed in                            satisfactory condition to undergo the procedure.                           After obtaining informed consent, the endoscope was  passed under direct vision. Throughout the                            procedure, the patient's blood pressure, pulse, and                            oxygen saturations were monitored continuously. The                            GIF [id number] #[id number] was introduced through the                            mouth, and advanced to the second part of duodenum.                            The upper GI endoscopy was accomplished  without                            difficulty. The patient tolerated the procedure                            well. Scope In: Scope Out: Findings:                 Mild inflammation characterized by erythema and                            friability was found in the gastric antrum.                            Biopsies were taken with a cold forceps for                            histology.                           The exam was otherwise without abnormality. Complications:            No immediate complications. Estimated blood loss:                            None. Estimated Blood Loss:     Estimated blood loss: none. Impression:               - Mild, non-specific distal gastritis. Biopsied to                            check for H. pylori.                           - The examination was otherwise normal. Recommendation:           - Patient has a contact number available for                            emergencies. The signs and symptoms of potential  delayed complications were discussed with the                            patient. Return to normal activities tomorrow.                            Written discharge instructions were provided to the                            patient.                           - Resume previous diet.                           - Continue present medications.                           - Await pathology results. Milus Banister, MD 03/22/2022 9:43:03 AM This report has been signed electronically.

## 2022-03-22 NOTE — Progress Notes (Signed)
0932 Robinul 0.1 mg IV given due large amount of secretions upon assessment.  MD made aware, vss 

## 2022-03-22 NOTE — Progress Notes (Signed)
Report given to PACU, vss 

## 2022-03-25 ENCOUNTER — Telehealth: Payer: Self-pay | Admitting: *Deleted

## 2022-03-25 ENCOUNTER — Encounter: Payer: Self-pay | Admitting: Gastroenterology

## 2022-03-25 ENCOUNTER — Telehealth: Payer: Self-pay

## 2022-03-25 NOTE — Telephone Encounter (Signed)
  Follow up Call-     03/22/2022    9:18 AM  Call back number  Post procedure Call Back phone  # [phone]  Permission to leave phone message Yes     Patient questions:  Do you have a fever, pain , or abdominal swelling? No. Pain Score  0 *  Have you tolerated food without any problems? Yes.    Have you been able to return to your normal activities? Yes.    Do you have any questions about your discharge instructions: Diet   No. Medications  No. Follow up visit  No.  Do you have questions or concerns about your Care? No.  Actions: * If pain score is 4 or above: No action needed, pain <4.

## 2022-03-25 NOTE — Telephone Encounter (Signed)
Left message on f/u call 

## 2022-04-09 ENCOUNTER — Encounter: Payer: Self-pay | Admitting: Gastroenterology

## 2022-04-09 ENCOUNTER — Telehealth: Payer: Self-pay | Admitting: Gastroenterology

## 2022-04-09 NOTE — Telephone Encounter (Signed)
The pt is asking for path results.  I have provided him the information in the letter below.  He will call back if he continues to have symptoms of bloating.  He is taking gas ex and pepcid at bedtime.  He will call back to update if this does not help     April 09, 2022     [place] [id number]     Dear Wanda Terry,   The biopsies taken during your recent upper endoscopy showed no sign of infection, serious inflammation or cancer.  You should continue to follow the recommendations that we discussed at the time of your procedure.     If you have any questions or concerns, please don't hesitate to call.   Sincerely,       Milus Banister, MD

## 2022-04-09 NOTE — Telephone Encounter (Signed)
Inbound call from patient stating that he would like a call back to discuss results from procedure on 6/2. Patient stated he works from [place], just in case you aren't able to call today.Please advise.

## 2022-08-05 ENCOUNTER — Ambulatory Visit
Admission: RE | Admit: 2022-08-05 | Discharge: 2022-08-05 | Disposition: A | Discharge status: Home / Self Care | Payer: BC Managed Care – PPO | Source: Ambulatory Visit

## 2022-08-05 VITALS — BP 123/82 | HR 92 | Temp 98.2°F | Resp 18

## 2022-08-05 DIAGNOSIS — H6123 Impacted cerumen, bilateral: Secondary | ICD-10-CM | POA: Diagnosis not present

## 2022-08-05 NOTE — Discharge Instructions (Addendum)
Follow-up with your primary care provider as needed.

## 2022-08-05 NOTE — ED Triage Notes (Signed)
Patient presents to [facility] for bilateral ear fullness x 2 days. Tried flushing ears with no improvement.

## 2022-08-05 NOTE — ED Provider Notes (Signed)
Wanda Terry    CSN: [id number] Arrival date & time: 08/05/22  1646      History   Chief Complaint Chief Complaint  Patient presents with   Ear Fullness    I think there is ear wax lodged in both my ears. - Entered by patient    HPI Wanda Terry is a 21 y.o. adult.  Patient presents with ear fullness and muffled hearing x 2 days.  Treatment attempted with ear flushing.  No fever, rash, cough, shortness of breath, vomiting, diarrhea, or other symptoms.  The history is provided by the patient and medical records.    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 05/08/2021   Mild intermittent asthma without complication 54/56/2563   History of food anaphylaxis 05/08/2021   Depression, major, recurrent, in partial remission ([place]) 05/08/2021   Anxiety 05/08/2021   Dermoid cyst of right ovary     Past Surgical History:  Procedure Laterality Date   NASAL ENDOSCOPY     ROBOTIC ASSISTED LAPAROSCOPIC OVARIAN CYSTECTOMY Right 03/15/2021   Procedure: XI ROBOTIC ASSISTED LAPAROSCOPIC OVARIAN CYSTECTOMY;  Surgeon: Homero Fellers, MD;  Location: [facility] ORS;  Service: Gynecology;  Laterality: Right;   WISDOM TOOTH EXTRACTION      OB History   No obstetric history on file.      Home Medications    Prior to Admission medications   Medication Sig Start Date End Date Taking? Authorizing Provider  ARIPiprazole (ABILIFY) 2 MG tablet Take by mouth. 01/03/18  Yes [provider]  sertraline (ZOLOFT) 50 MG tablet Take by mouth. 12/31/17  Yes [provider]  albuterol (VENTOLIN HFA) 108 (90 Base) MCG/ACT inhaler Inhale 2 puffs into the  lungs every 6 (six) hours as needed for wheezing or shortness of breath. 05/08/21   Karamalegos, Devonne Doughty, DO  AUVI-Q 0.3 MG/0.3ML SOAJ injection Inject 0.3 mg into the muscle as needed for anaphylaxis. 05/08/21   Karamalegos, Devonne Doughty, DO  cetirizine (ZYRTEC) 10 MG tablet Take by mouth.    [provider]  lamoTRIgine (LAMICTAL) 150 MG tablet Take 300 mg by mouth daily.    [provider]  loratadine (CLARITIN) 10 MG tablet Take 10 mg by mouth daily.    [provider]  Melatonin 5 MG CAPS Take 5 mg by mouth at bedtime as needed (sleep).    [provider]  propranolol (INDERAL) 10 MG tablet Take 10 mg by mouth daily. 02/11/22   [provider]  sertraline (ZOLOFT) 100 MG tablet Take 200 mg by mouth daily.    [provider]  Simethicone 125 MG CAPS  12/26/21   [provider]  testosterone cypionate (DEPOTESTOSTERONE CYPIONATE) 200 MG/ML injection SMARTSIG:0.375 Milliliter(s) SUB-Q Once a Week 02/05/22   [provider]  TESTOSTERONE IM Inject 0.375 mLs into the muscle once a week.    [provider]    Family History Family History  Problem Relation Age of Onset   Diabetes Maternal Grandfather    Heart disease Maternal Grandfather    Colon cancer Neg Hx    Colon polyps Neg Hx  Stomach cancer Neg Hx    Rectal cancer Neg Hx    Esophageal cancer Neg Hx     Social History Social History   Tobacco Use   Smoking status: Never   Smokeless tobacco: Never  Vaping Use   Vaping Use: Never used  Substance Use Topics   Alcohol use: Not Currently   Drug use: Never     Allergies   Amoxicillin, Other, Peanut-containing drug products, and Penicillins   Review of Systems Review of Systems  Constitutional:  Negative for chills and fever.  HENT:  Positive for ear pain and hearing loss. Negative for sore throat.   Respiratory:  Negative for cough and shortness of breath.   Gastrointestinal:  Negative for  diarrhea and vomiting.  Skin:  Negative for rash.  All other systems reviewed and are negative.    Physical Exam Triage Vital Signs ED Triage Vitals  Enc Vitals Group     BP      Pulse      Resp      Temp      Temp src      SpO2      Weight      Height      Head Circumference      Peak Flow      Pain Score      Pain Loc      Pain Edu?      Excl. in [place]?    No data found.  Updated Vital Signs BP 123/82   Pulse 92   Temp 98.2 F (36.8 C)   Resp 18   SpO2 97%   Visual Acuity Right Eye Distance:   Left Eye Distance:   Bilateral Distance:    Right Eye Near:   Left Eye Near:    Bilateral Near:     Physical Exam Vitals and nursing note reviewed.  Constitutional:      General: He is not in acute distress.    Appearance: Normal appearance. He is well-developed. He is not ill-appearing.  HENT:     Right Ear: There is impacted cerumen.     Left Ear: There is impacted cerumen.     Ears:     Comments: TMs and canals noted to be clear after cerumen removal.    Nose: Nose normal.     Mouth/Throat:     Mouth: Mucous membranes are moist.     Pharynx: Oropharynx is clear.  Cardiovascular:     Rate and Rhythm: Normal rate and regular rhythm.     Heart sounds: Normal heart sounds.  Pulmonary:     Effort: Pulmonary effort is normal. No respiratory distress.     Breath sounds: Normal breath sounds.  Musculoskeletal:     Cervical back: Neck supple.  Skin:    General: Skin is warm and dry.  Neurological:     Mental Status: He is alert.  Psychiatric:        Mood and Affect: Mood normal.        Behavior: Behavior normal.      UC Treatments / Results  Labs (all labs ordered are listed, but only abnormal results are displayed) Labs Reviewed - No data to display  EKG   Radiology No results found.  Procedures Procedures (including critical care time)  Medications Ordered in UC Medications - No data to display  Initial Impression / Assessment and Plan /  UC Course  I have reviewed the triage vital signs and the nursing notes.  Pertinent labs & imaging results that were available during my care of the patient were reviewed by me and considered in my medical decision making (see chart for details).    Bilateral cerumen impaction.  Cerumen removed via irrigation by RN.  Patient reports relief of symptoms after cerumen removal.  Instructed patient to follow-up with PCP as needed.  Education provided on earwax buildup.  Final Clinical Impressions(s) / UC Diagnoses   Final diagnoses:  Bilateral impacted cerumen     Discharge Instructions      Follow up with your primary care provider as needed.        ED Prescriptions   None    PDMP not reviewed this encounter.   Sharion Balloon, NP 08/05/22 1758

## 2022-10-10 IMAGING — US US PELVIS COMPLETE
1 series · 13 of 25 positions shown · non-contrast
Comparison: None

CLINICAL DATA: Dermoid tumor, follow-up; LMP 3 weeks ago

EXAM:
TRANSABDOMINAL ULTRASOUND OF PELVIS
TECHNIQUE: Transabdominal ultrasound examination of the pelvis was performed
including evaluation of the uterus, ovaries, adnexal regions, and
pelvic cul-de-sac. Transvaginal imaging was not performed

[Series 1: us pelvis complete · 0.20mm/px · 13 of 28 slices shown]
[im 1/28]
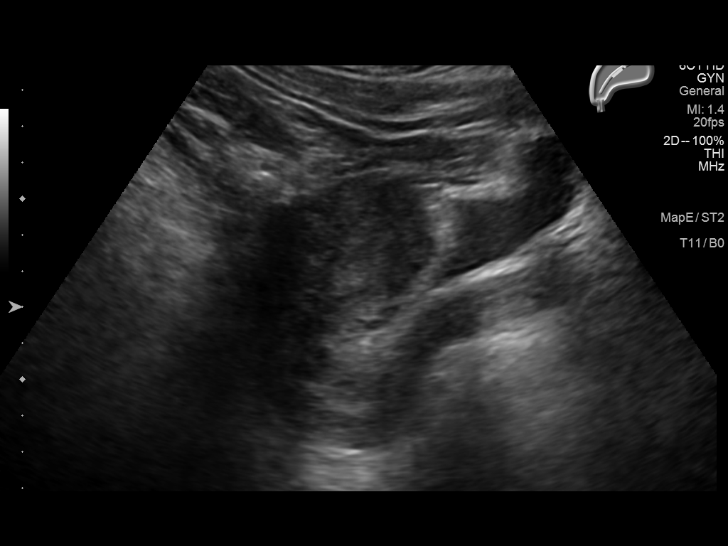
[im 3/28]
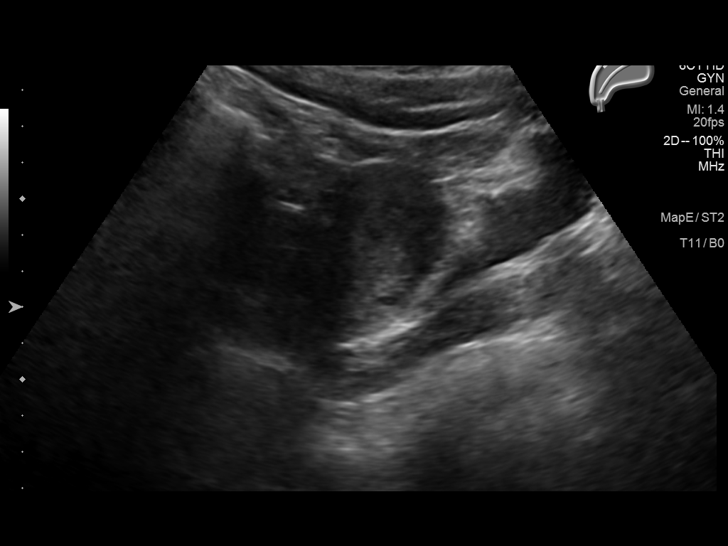
[im 5/28]
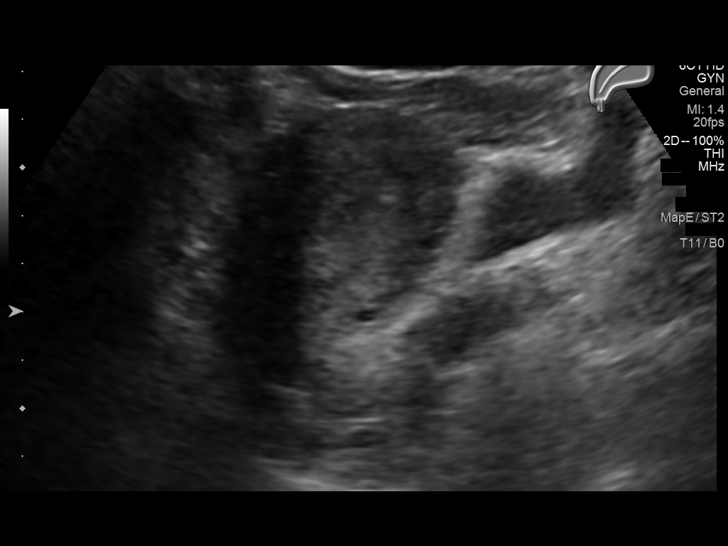
[im 7/28]
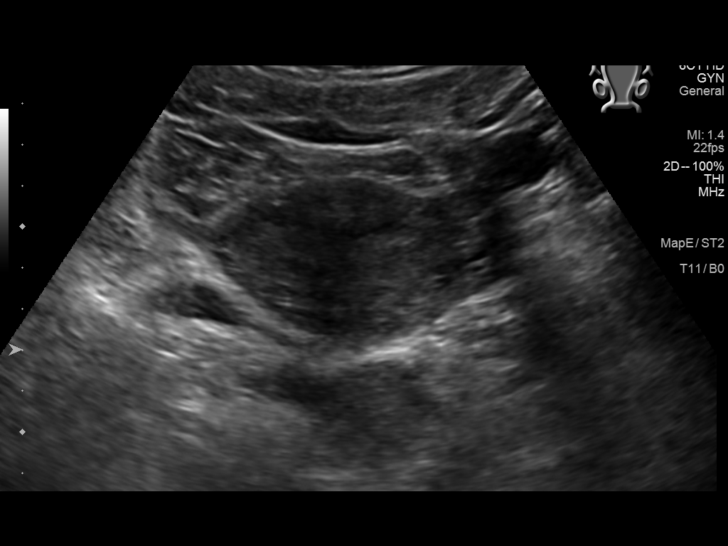
[im 10/28]
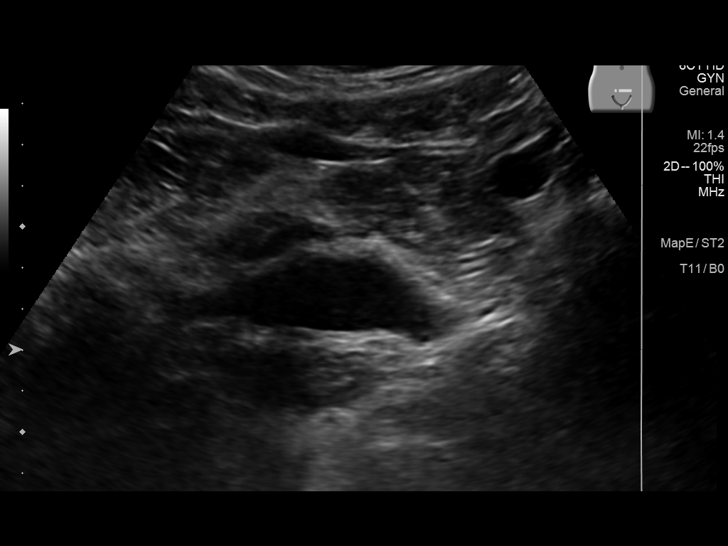
[im 12/28]
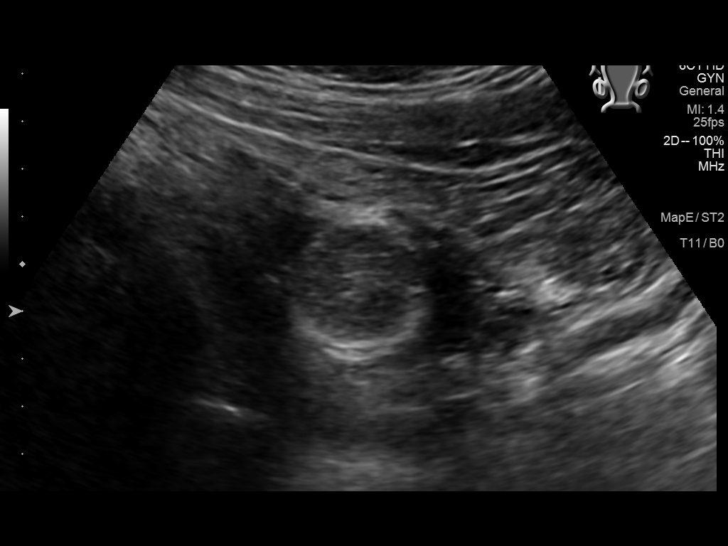
[im 14/28]
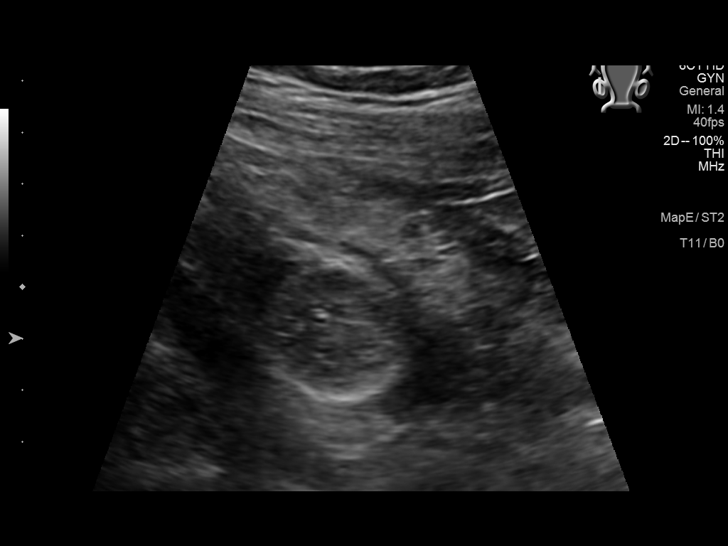
[im 16/28]
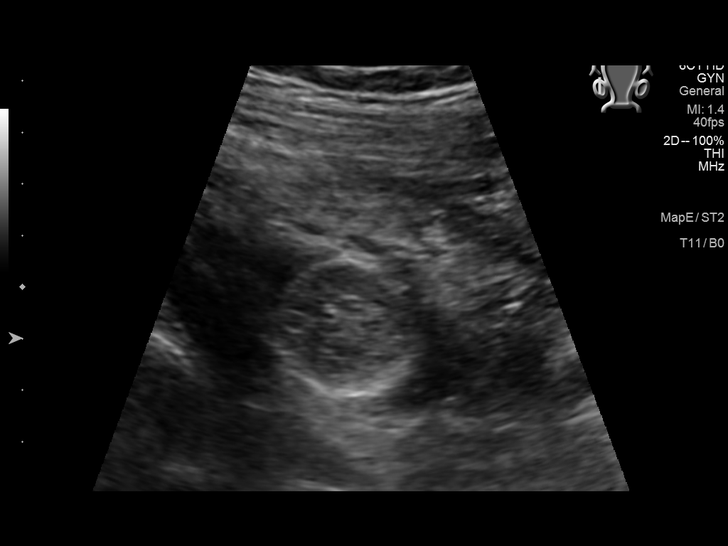
[im 19/28]
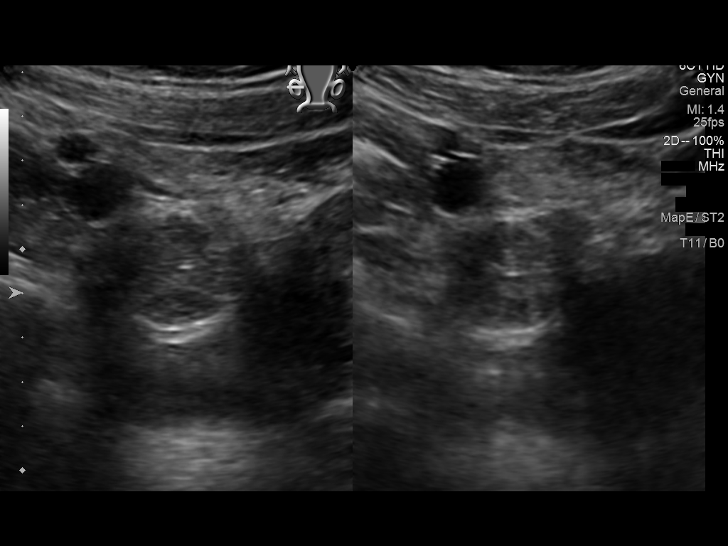
[im 21/28]
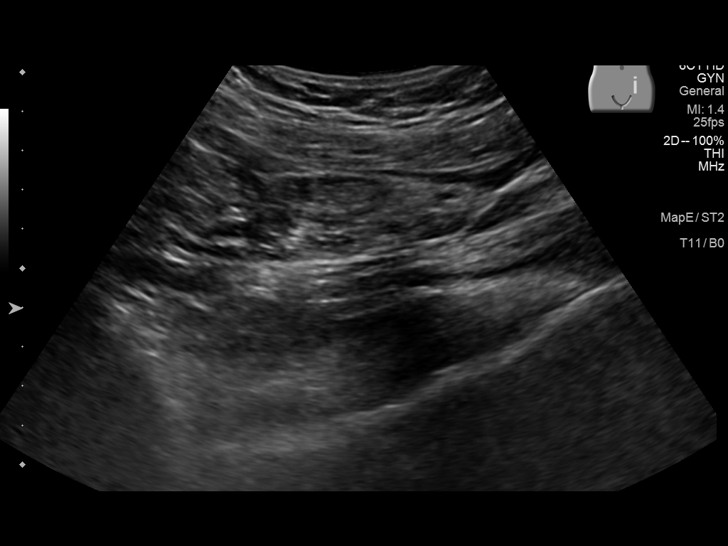
[im 23/28]
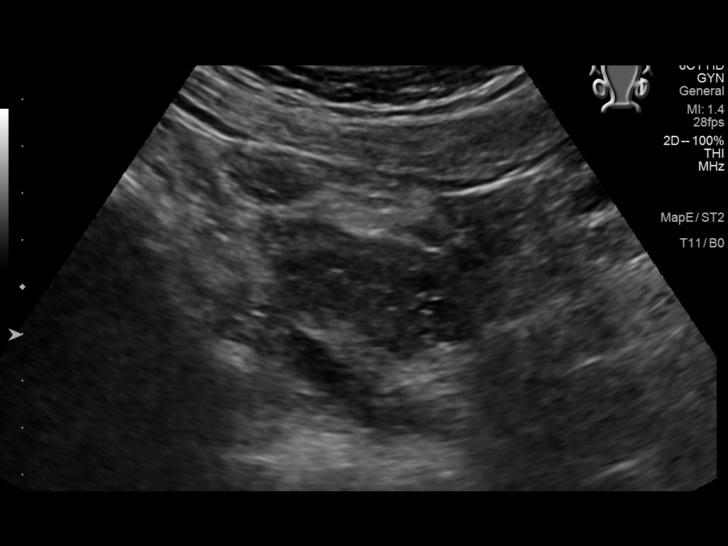
[im 25/28]
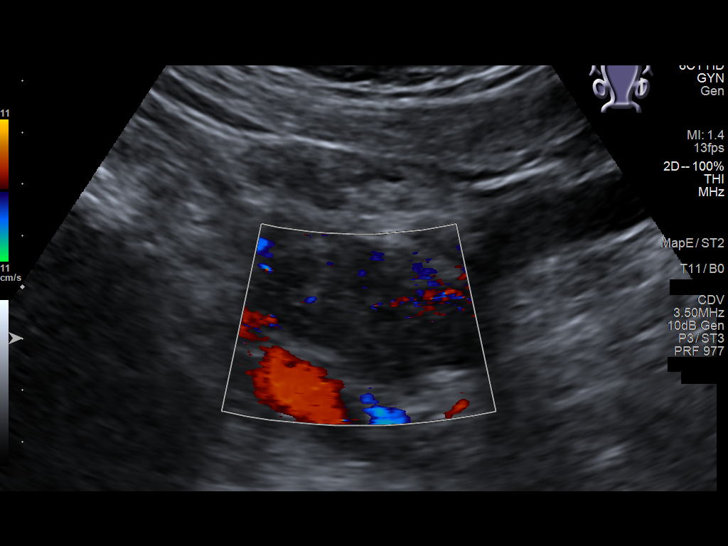
[im 28/28]
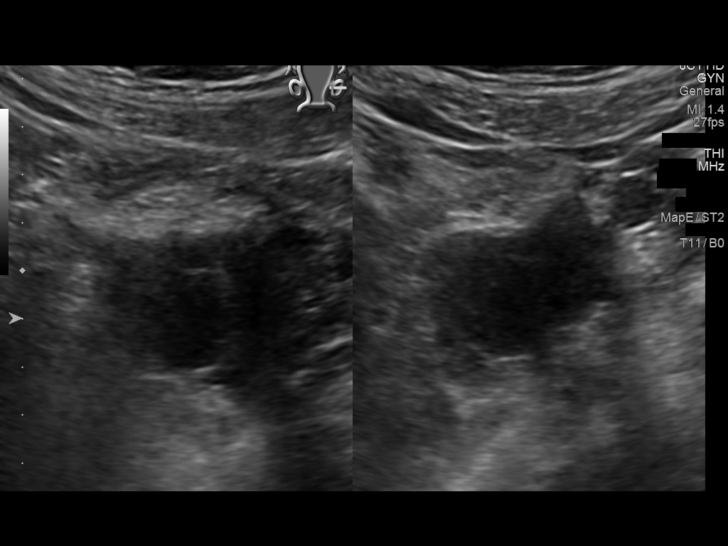

[13 of 25 positions shown; findings below may reference images not displayed]

FINDINGS: Uterus

Measurements: 7.6 x 3.6 x 5.3 cm = volume: 74 mL. Anteverted. Normal
morphology without mass

Endometrium

Thickness: 13 mm.  No endometrial fluid or focal abnormality

Right ovary

Measurements: 3.6 x 3.1 x 2.6 cm = volume: 15.6 mL. Again identified
RIGHT ovarian nodule 2.7 x 2.8 x 2.5 cm (previously 2.5 x 2.2 x
cm), isoechoic to minimally hyperechoic, slightly less hyperechoic
than on previous exam. Lesion remains compatible with a dermoid
tumor. A single central nonshadowing echogenic focus is seen which
could represent a developing calcification.

Left ovary

Measurements: 4.1 x 2.0 x 2.5 cm = volume: 10.6 mL. Normal
morphology without mass

Other findings:  No free pelvic fluid or additional adnexal masses.
IMPRESSION: Again identified RIGHT ovarian lesion 2.8 cm greatest size,
increased from previous exam, slightly less echogenic than on the
previous exam but remaining consistent with a dermoid tumor.
Question developing tiny central calcification.

Remainder of exam unremarkable.

## 2023-04-17 IMAGING — US US ABDOMEN COMPLETE
1 series · 15 of 25 positions shown · non-contrast
Comparison: None.

CLINICAL DATA: Postprandial abdominal bloating. History of diabetic
gastroparesis.

EXAM:
ABDOMEN ULTRASOUND COMPLETE

[Series 1: us abdomen complete mc & wl · 15 of 84 slices shown]
[im 1/84]
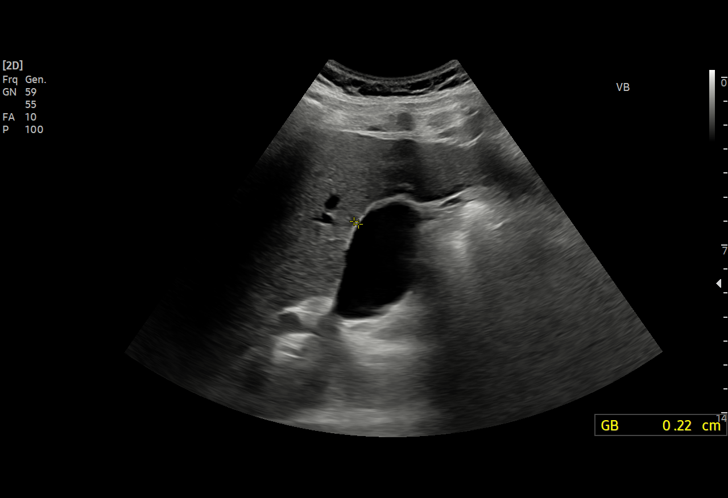
[im 7/84]
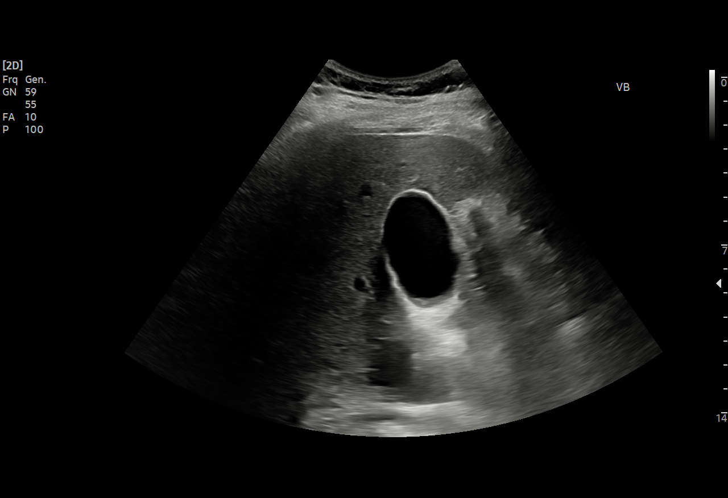
[im 14/84]
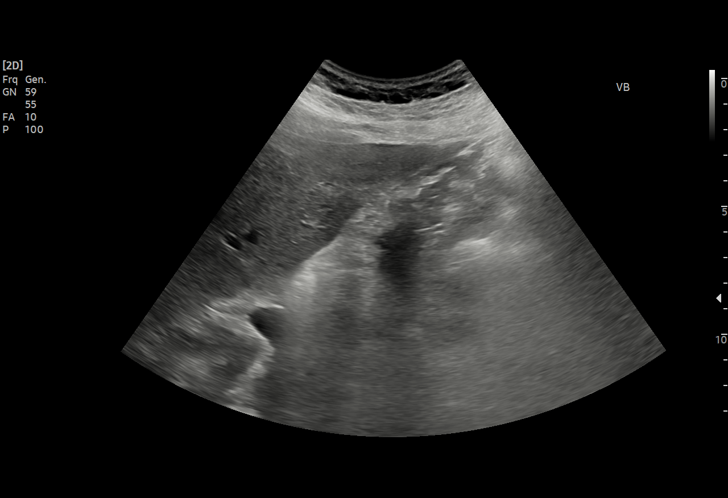
[im 18/84]
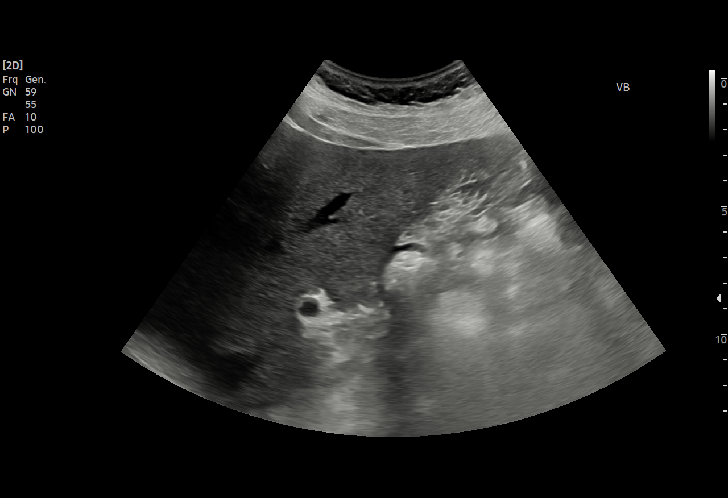
[im 25/84]
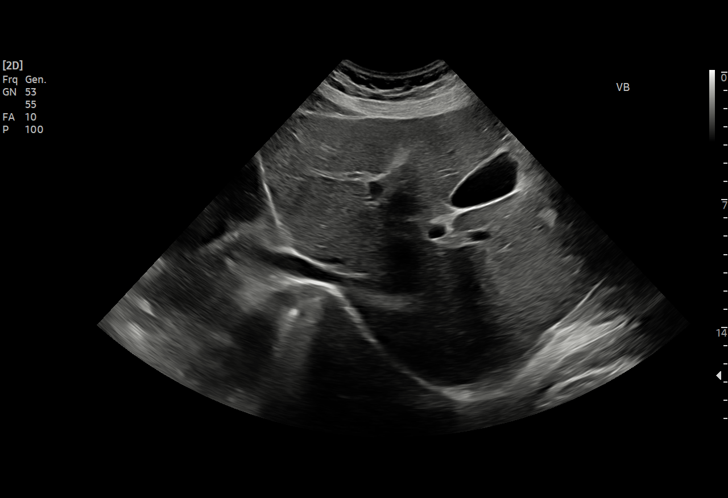
[im 32/84]
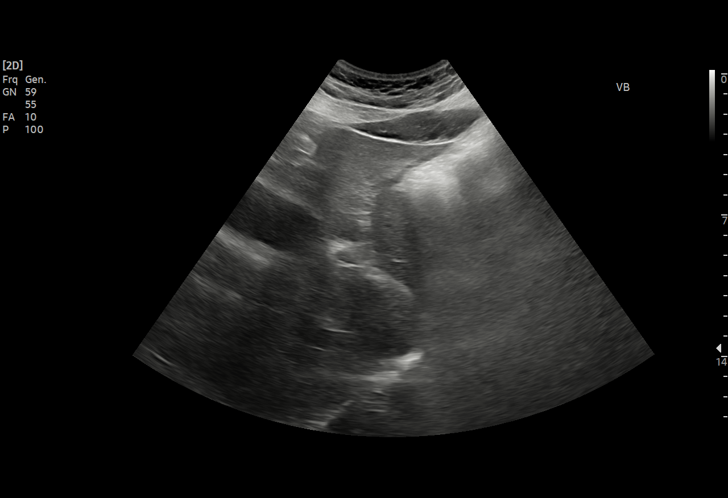
[im 35/84]
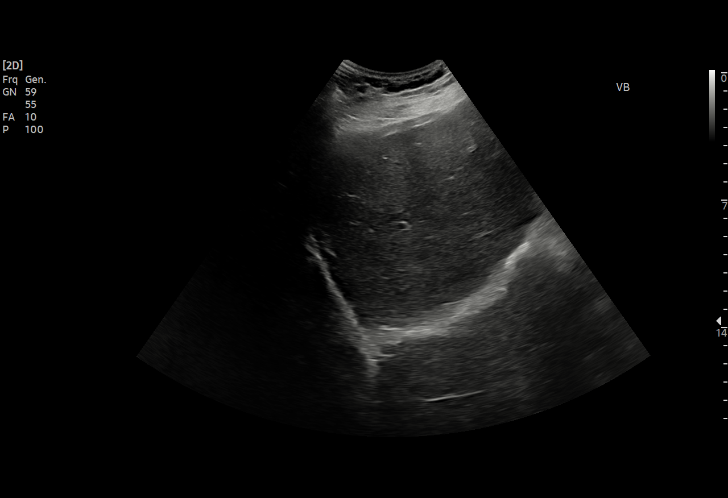
[im 42/84]
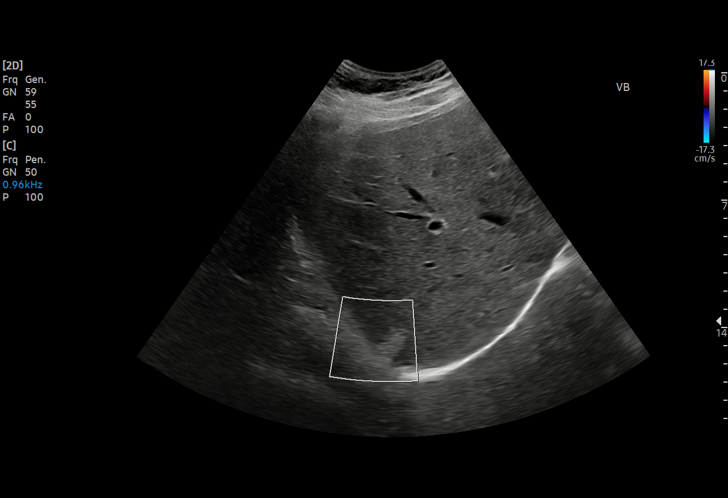
[im 49/84]
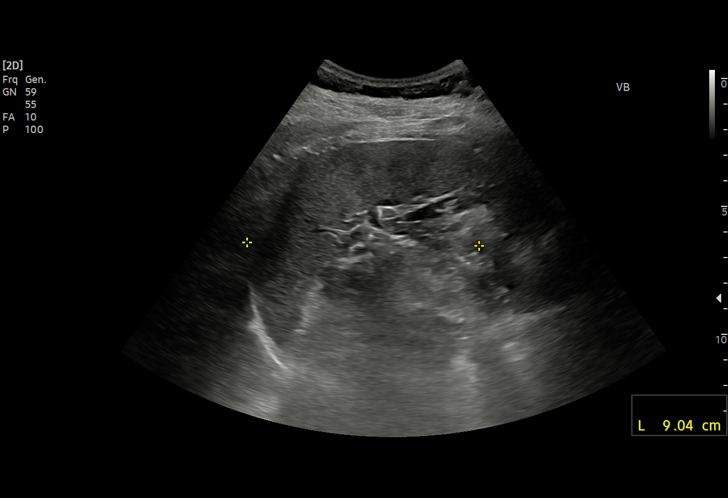
[im 52/84]
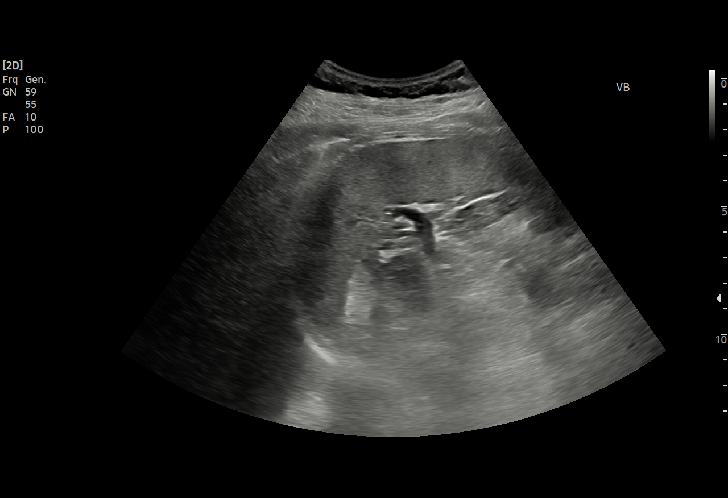
[im 59/84]
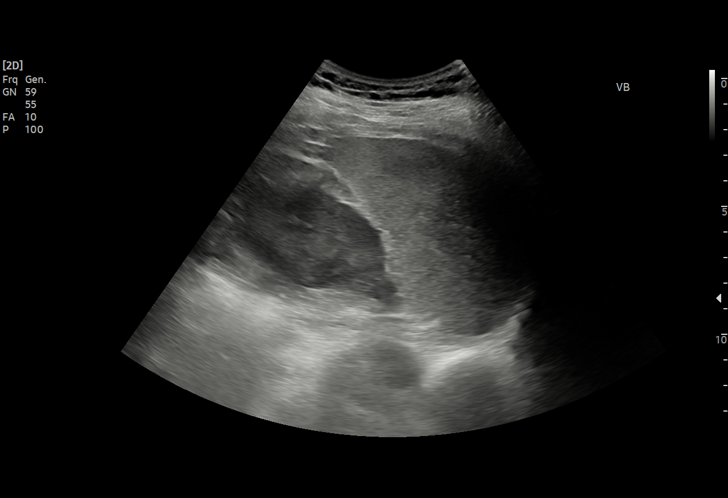
[im 66/84]
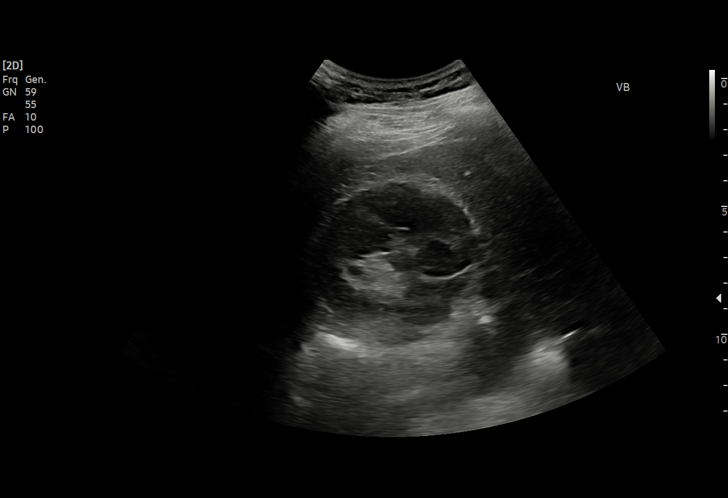
[im 70/84]
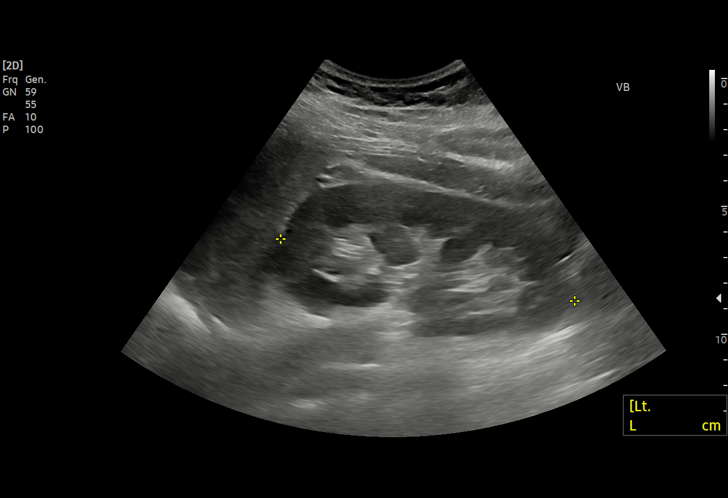
[im 77/84]
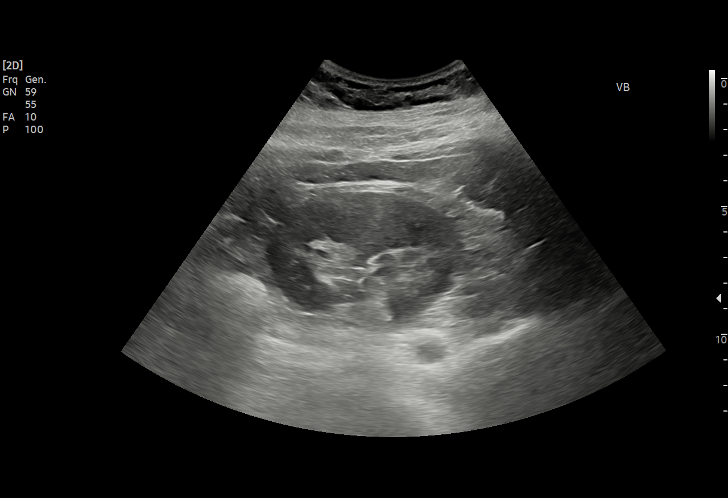
[im 84/84]
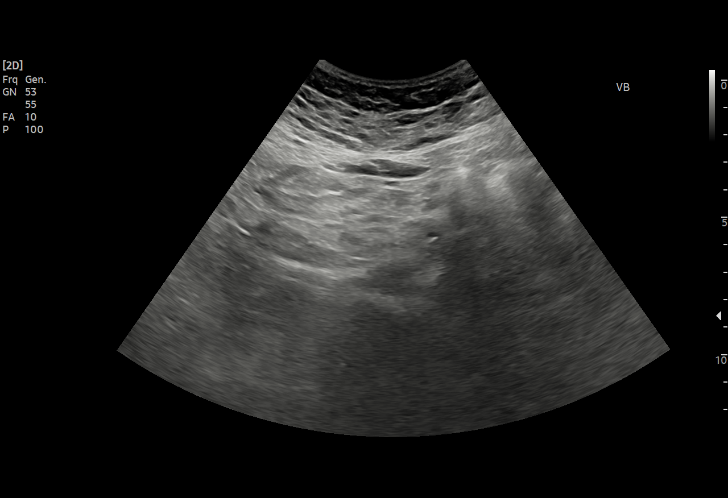

[15 of 25 positions shown; findings below may reference images not displayed]

FINDINGS: Gallbladder: Physiologically distended. No gallstones or wall
thickening visualized. No sonographic Murphy sign noted by
sonographer.

Common bile duct: Diameter: 3 mm.

Liver: Normal parenchymal echogenicity. There is a 1.9 x 1.5 x
cm subcapsular hyperechoic focus in the right hepatic lobe. Portal
vein is patent on color Doppler imaging with normal direction of
blood flow towards the liver.

IVC: No abnormality visualized.

Pancreas: Visualized portion unremarkable, the majority is obscured
by bowel gas.

Spleen: Size and appearance within normal limits.

Right Kidney: Length: 11.2 cm. Normal parenchymal echogenicity. No
hydronephrosis. No visualized stone or focal lesion.

Left Kidney: Length: 11.7 cm. Normal parenchymal echogenicity. No
hydronephrosis. No visualized stone or focal lesion.

Abdominal aorta: No aneurysm visualized.

Other findings: No abdominal ascites
IMPRESSION: 1. Hyperechoic subcapsular focus in the right lobe of the liver
measuring 1.9 cm, typical of a benign hemangioma.
2. Otherwise unremarkable abdominal ultrasound.

## 2024-01-30 ENCOUNTER — Ambulatory Visit
Admission: RE | Admit: 2024-01-30 | Discharge: 2024-01-30 | Disposition: A | Discharge status: Home / Self Care | Source: Ambulatory Visit | Attending: Emergency Medicine | Admitting: Emergency Medicine

## 2024-01-30 ENCOUNTER — Other Ambulatory Visit: Payer: Self-pay

## 2024-01-30 VITALS — BP 131/84 | HR 106 | Temp 98.0°F | Resp 18

## 2024-01-30 DIAGNOSIS — J029 Acute pharyngitis, unspecified: Secondary | ICD-10-CM

## 2024-01-30 LAB — POCT RAPID STREP A (OFFICE): Rapid Strep A Screen: NEGATIVE

## 2024-01-30 MED ORDER — LIDOCAINE VISCOUS HCL 2 % MT SOLN: 15.0000 mL | OROMUCOSAL | 0 refills | Status: AC | PRN

## 2024-01-30 NOTE — ED Triage Notes (Signed)
 Patient presents to [facility] for evaluation of sore throat since last night.  Patient denies other associated symptoms, including fever

## 2024-01-30 NOTE — Discharge Instructions (Signed)
 Your evaluated for your sore throat  Strep testing is negative for bacteria, symptoms most likely related to the weather change  You may gargle and spit lidocaine solution as needed for temporary relief to your throat  You may continue Advil as needed with Tylenol additionally  You may attempt salt water gargles, throat lozenges warm liquids and soft food  You may follow-up for any worsening symptoms

## 2024-01-30 NOTE — ED Provider Notes (Signed)
 Renaldo Fiddler    CSN: [id number] Arrival date & time: 01/30/24  1106      History   Chief Complaint Chief Complaint  Patient presents with   Sore Throat    Entered by patient     HPI Shandricka Monroy is a 23 y.o. adult.   Patient presents for evaluation of a sore throat beginning 1 day ago.  No known sick contacts.  Tolerating food and liquids.  Has attempted use of Advil.  Denies fever, congestion, ear pain or coughing.  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 05/08/2021   Mild intermittent asthma without complication 05/08/2021   History of food anaphylaxis 05/08/2021   Depression, major, recurrent, in partial remission ([facility]) 05/08/2021   Anxiety 05/08/2021   Dermoid cyst of right ovary     Past Surgical History:  Procedure Laterality Date   NASAL ENDOSCOPY     ROBOTIC ASSISTED LAPAROSCOPIC OVARIAN CYSTECTOMY Right 03/15/2021   Procedure: XI ROBOTIC ASSISTED LAPAROSCOPIC OVARIAN CYSTECTOMY;  Surgeon: Natale Milch, MD;  Location: [facility] ORS;  Service: Gynecology;  Laterality: Right;   WISDOM TOOTH EXTRACTION      OB History   No obstetric history on file.      Home Medications    Prior to Admission medications   Medication Sig Start Date End Date Taking? Authorizing Provider  lidocaine (XYLOCAINE) 2 % solution Use as directed 15 mLs in the mouth or throat every 4 (four) hours as needed. 01/30/24  Yes Kayton Ripp R, NP  albuterol (VENTOLIN HFA) 108 (90 Base) MCG/ACT inhaler Inhale 2 puffs into the lungs every 6 (six) hours as needed for wheezing or shortness of breath. 05/08/21   Karamalegos, Netta Neat, DO  ARIPiprazole  (ABILIFY) 2 MG tablet Take by mouth. 01/03/18   [provider]  AUVI-Q 0.3 MG/0.3ML SOAJ injection Inject 0.3 mg into the muscle as needed for anaphylaxis. 05/08/21   Karamalegos, Netta Neat, DO  cetirizine (ZYRTEC) 10 MG tablet Take by mouth.    [provider]  lamoTRIgine (LAMICTAL) 150 MG tablet Take 300 mg by mouth daily.    [provider]  loratadine (CLARITIN) 10 MG tablet Take 10 mg by mouth daily.    [provider]  Melatonin 5 MG CAPS Take 5 mg by mouth at bedtime as needed (sleep).    [provider]  propranolol (INDERAL) 10 MG tablet Take 10 mg by mouth daily. 02/11/22   [provider]  sertraline (ZOLOFT) 100 MG tablet Take 200 mg by mouth daily.    [provider]  sertraline (ZOLOFT) 50 MG tablet Take by mouth. 12/31/17   [provider]  Simethicone 125 MG CAPS  12/26/21   [provider]  testosterone cypionate (DEPOTESTOSTERONE CYPIONATE) 200 MG/ML injection SMARTSIG:0.375 Milliliter(s) SUB-Q Once a Week 02/05/22   [provider]  TESTOSTERONE IM Inject 0.375 mLs into the muscle once a week.    [provider]    Family History Family History  Problem Relation Age of Onset   Diabetes Maternal Grandfather    Heart disease Maternal Grandfather  Colon cancer Neg Hx    Colon polyps Neg Hx    Stomach cancer Neg Hx    Rectal cancer Neg Hx    Esophageal cancer Neg Hx     Social History Social History   Tobacco Use   Smoking status: Never   Smokeless tobacco: Never  Vaping Use   Vaping status: Never Used  Substance Use Topics   Alcohol use: Not Currently   Drug use: Never     Allergies   Amoxicillin, Other, Peanut-containing drug products, and Penicillins   Review of Systems Review of Systems   Physical Exam Triage Vital Signs ED Triage Vitals [01/30/24 1115]  Encounter Vitals Group     BP 131/84     Systolic BP Percentile      Diastolic BP Percentile       Pulse Rate (!) 106     Resp 18     Temp 98 F (36.7 C)     Temp Source Oral     SpO2      Weight      Height      Head Circumference      Peak Flow      Pain Score 6     Pain Loc      Pain Education      Exclude from Growth Chart    No data found.  Updated Vital Signs BP 131/84 (BP Location: Left Arm)   Pulse (!) 106   Temp 98 F (36.7 C) (Oral)   Resp 18   Visual Acuity Right Eye Distance:   Left Eye Distance:   Bilateral Distance:    Right Eye Near:   Left Eye Near:    Bilateral Near:     Physical Exam Constitutional:      Appearance: He is well-developed.  HENT:     Head: Normocephalic.     Mouth/Throat:     Pharynx: No oropharyngeal exudate or posterior oropharyngeal erythema.  Musculoskeletal:     Cervical back: Normal range of motion.  Lymphadenopathy:     Cervical: Cervical adenopathy present.  Neurological:     Mental Status: He is alert and oriented to person, place, and time.      UC Treatments / Results  Labs (all labs ordered are listed, but only abnormal results are displayed) Labs Reviewed  POCT RAPID STREP A (OFFICE) - Normal    EKG   Radiology No results found.  Procedures Procedures (including critical care time)  Medications Ordered in UC Medications - No data to display  Initial Impression / Assessment and Plan / UC Course  I have reviewed the triage vital signs and the nursing notes.  Pertinent labs & imaging results that were available during my care of the patient were reviewed by me and considered in my medical decision making (see chart for details).  Sore throat  Vital signs are stable, patient in no signs of distress nontoxic-appearing, no erythema or exudate noted to the oropharynx or tonsils, rapid strep testing negative, etiology most likely related to seasonal weather change, prescribed viscous lidocaine and discussed supportive measures with follow-up as needed Final Clinical Impressions(s) / UC Diagnoses    Final diagnoses:  Sore throat     Discharge Instructions      Your evaluated for your sore throat  Strep testing is negative for bacteria, symptoms most likely related to the weather change  You may gargle and spit lidocaine solution as needed for temporary relief to your throat  You  may continue Advil as needed with Tylenol additionally  You may attempt salt water gargles, throat lozenges warm liquids and soft food  You may follow-up for any worsening symptoms   ED Prescriptions     Medication Sig Dispense Auth. Provider   lidocaine (XYLOCAINE) 2 % solution Use as directed 15 mLs in the mouth or throat every 4 (four) hours as needed. 100 mL Valinda Hoar, NP      PDMP not reviewed this encounter.   Valinda Hoar, NP 01/30/24 1130
